# Patient Record
Sex: Male | Born: 1975
Health system: Southern US, Community
[De-identification: ages and names within clinical notes are randomized; demographics above are authoritative.]

## PROBLEM LIST (undated history)

## (undated) DIAGNOSIS — M1990 Unspecified osteoarthritis, unspecified site: Secondary | ICD-10-CM

---

## 1998-03-28 ENCOUNTER — Emergency Department (HOSPITAL_COMMUNITY): Admission: EM | Admit: 1998-03-28 | Discharge: 1998-03-28 | Payer: Self-pay | Admitting: Emergency Medicine

## 1998-05-12 ENCOUNTER — Emergency Department (HOSPITAL_COMMUNITY): Admission: EM | Admit: 1998-05-12 | Discharge: 1998-05-12 | Payer: Self-pay | Admitting: *Deleted

## 1998-06-25 ENCOUNTER — Emergency Department (HOSPITAL_COMMUNITY): Admission: EM | Admit: 1998-06-25 | Discharge: 1998-06-25 | Payer: Self-pay | Admitting: Emergency Medicine

## 1999-03-23 ENCOUNTER — Emergency Department (HOSPITAL_COMMUNITY): Admission: EM | Admit: 1999-03-23 | Discharge: 1999-03-23 | Payer: Self-pay | Admitting: Emergency Medicine

## 1999-03-24 ENCOUNTER — Encounter: Payer: Self-pay | Admitting: Emergency Medicine

## 2001-03-01 ENCOUNTER — Emergency Department (HOSPITAL_COMMUNITY): Admission: EM | Admit: 2001-03-01 | Discharge: 2001-03-02 | Payer: Self-pay | Admitting: Emergency Medicine

## 2003-01-25 ENCOUNTER — Emergency Department (HOSPITAL_COMMUNITY): Admission: EM | Admit: 2003-01-25 | Discharge: 2003-01-25 | Payer: Self-pay | Admitting: Emergency Medicine

## 2003-01-25 ENCOUNTER — Encounter: Payer: Self-pay | Admitting: Emergency Medicine

## 2012-03-27 DIAGNOSIS — M1990 Unspecified osteoarthritis, unspecified site: Secondary | ICD-10-CM

## 2012-03-27 HISTORY — DX: Unspecified osteoarthritis, unspecified site: M19.90

## 2012-04-14 ENCOUNTER — Encounter (HOSPITAL_BASED_OUTPATIENT_CLINIC_OR_DEPARTMENT_OTHER): Payer: Self-pay | Admitting: *Deleted

## 2012-04-20 ENCOUNTER — Ambulatory Visit (HOSPITAL_BASED_OUTPATIENT_CLINIC_OR_DEPARTMENT_OTHER)
Admission: RE | Admit: 2012-04-20 | Discharge: 2012-04-21 | Disposition: A | Discharge status: Home / Self Care | Payer: Worker's Compensation | Source: Ambulatory Visit | Attending: Orthopedic Surgery | Admitting: Orthopedic Surgery

## 2012-04-20 ENCOUNTER — Ambulatory Visit (HOSPITAL_BASED_OUTPATIENT_CLINIC_OR_DEPARTMENT_OTHER): Payer: Worker's Compensation | Admitting: Certified Registered Nurse Anesthetist

## 2012-04-20 ENCOUNTER — Encounter (HOSPITAL_BASED_OUTPATIENT_CLINIC_OR_DEPARTMENT_OTHER)
Admission: RE | Disposition: A | Discharge status: Home / Self Care | Payer: Self-pay | Source: Ambulatory Visit | Attending: Orthopedic Surgery

## 2012-04-20 ENCOUNTER — Encounter (HOSPITAL_BASED_OUTPATIENT_CLINIC_OR_DEPARTMENT_OTHER): Payer: Self-pay | Admitting: Certified Registered Nurse Anesthetist

## 2012-04-20 ENCOUNTER — Encounter (HOSPITAL_BASED_OUTPATIENT_CLINIC_OR_DEPARTMENT_OTHER): Payer: Self-pay

## 2012-04-20 ENCOUNTER — Ambulatory Visit (HOSPITAL_COMMUNITY): Payer: Worker's Compensation

## 2012-04-20 DIAGNOSIS — Y9269 Other specified industrial and construction area as the place of occurrence of the external cause: Secondary | ICD-10-CM | POA: Insufficient documentation

## 2012-04-20 DIAGNOSIS — M6240 Contracture of muscle, unspecified site: Secondary | ICD-10-CM | POA: Insufficient documentation

## 2012-04-20 DIAGNOSIS — M2140 Flat foot [pes planus] (acquired), unspecified foot: Secondary | ICD-10-CM | POA: Insufficient documentation

## 2012-04-20 DIAGNOSIS — X58XXXS Exposure to other specified factors, sequela: Secondary | ICD-10-CM | POA: Insufficient documentation

## 2012-04-20 DIAGNOSIS — M19079 Primary osteoarthritis, unspecified ankle and foot: Secondary | ICD-10-CM | POA: Insufficient documentation

## 2012-04-20 DIAGNOSIS — IMO0001 Reserved for inherently not codable concepts without codable children: Secondary | ICD-10-CM | POA: Insufficient documentation

## 2012-04-20 DIAGNOSIS — M1990 Unspecified osteoarthritis, unspecified site: Secondary | ICD-10-CM

## 2012-04-20 HISTORY — DX: Unspecified osteoarthritis, unspecified site: M19.90

## 2012-04-20 HISTORY — PX: ANKLE FUSION: SHX881

## 2012-04-20 SURGERY — ARTHRODESIS ANKLE
Anesthesia: General | Site: Ankle | Laterality: Right | Wound class: Clean

## 2012-04-20 MED ORDER — SENNA 8.6 MG PO TABS
1.0000 | ORAL_TABLET | Freq: Two times a day (BID) | ORAL | Status: DC
  Administered 2012-04-20: 8.6 mg via ORAL

## 2012-04-20 MED ORDER — HYDROMORPHONE HCL PF 1 MG/ML IJ SOLN
1.0000 mg | INTRAMUSCULAR | Status: DC | PRN
  Administered 2012-04-20 – 2012-04-21 (×4): 1 mg via INTRAVENOUS

## 2012-04-20 MED ORDER — CEFAZOLIN SODIUM 1-5 GM-% IV SOLN
INTRAVENOUS | Status: DC | PRN
  Administered 2012-04-20: 1 g via INTRAVENOUS
  Administered 2012-04-20: 2 g via INTRAVENOUS

## 2012-04-20 MED ORDER — METOCLOPRAMIDE HCL 5 MG PO TABS: 5.0000 mg | ORAL_TABLET | Freq: Three times a day (TID) | ORAL | Status: DC | PRN

## 2012-04-20 MED ORDER — MIDAZOLAM HCL 5 MG/5ML IJ SOLN
INTRAMUSCULAR | Status: DC | PRN
  Administered 2012-04-20: 1 mg via INTRAVENOUS

## 2012-04-20 MED ORDER — ONDANSETRON HCL 4 MG/2ML IJ SOLN: 4.0000 mg | Freq: Four times a day (QID) | INTRAMUSCULAR | Status: DC | PRN

## 2012-04-20 MED ORDER — 0.9 % SODIUM CHLORIDE (POUR BTL) OPTIME
TOPICAL | Status: DC | PRN
  Administered 2012-04-20: 1000 mL

## 2012-04-20 MED ORDER — BUPIVACAINE-EPINEPHRINE PF 0.5-1:200000 % IJ SOLN
INTRAMUSCULAR | Status: DC | PRN
  Administered 2012-04-20: 30 mL

## 2012-04-20 MED ORDER — ACETAMINOPHEN 10 MG/ML IV SOLN
1000.0000 mg | Freq: Once | INTRAVENOUS | Status: AC
  Administered 2012-04-20: 1000 mg via INTRAVENOUS

## 2012-04-20 MED ORDER — PROPOFOL 10 MG/ML IV EMUL
INTRAVENOUS | Status: DC | PRN
  Administered 2012-04-20: 300 mg via INTRAVENOUS

## 2012-04-20 MED ORDER — METOCLOPRAMIDE HCL 5 MG/ML IJ SOLN: 5.0000 mg | Freq: Three times a day (TID) | INTRAMUSCULAR | Status: DC | PRN

## 2012-04-20 MED ORDER — OXYCODONE HCL 5 MG PO TABS
5.0000 mg | ORAL_TABLET | ORAL | Status: DC | PRN
  Administered 2012-04-20 – 2012-04-21 (×5): 10 mg via ORAL

## 2012-04-20 MED ORDER — ONDANSETRON HCL 4 MG/2ML IJ SOLN
INTRAMUSCULAR | Status: DC | PRN
  Administered 2012-04-20: 4 mg via INTRAVENOUS

## 2012-04-20 MED ORDER — DIPHENHYDRAMINE HCL 12.5 MG/5ML PO ELIX: 12.5000 mg | ORAL_SOLUTION | ORAL | Status: DC | PRN

## 2012-04-20 MED ORDER — METHOCARBAMOL 500 MG PO TABS
500.0000 mg | ORAL_TABLET | Freq: Four times a day (QID) | ORAL | Status: DC | PRN
  Administered 2012-04-20 – 2012-04-21 (×4): 500 mg via ORAL

## 2012-04-20 MED ORDER — BACITRACIN ZINC 500 UNIT/GM EX OINT
TOPICAL_OINTMENT | CUTANEOUS | Status: DC | PRN
  Administered 2012-04-20: 1 via TOPICAL

## 2012-04-20 MED ORDER — SODIUM CHLORIDE 0.9 % IV SOLN
INTRAVENOUS | Status: DC
  Administered 2012-04-20: 14:00:00 via INTRAVENOUS

## 2012-04-20 MED ORDER — DOCUSATE SODIUM 100 MG PO CAPS: 100.0000 mg | ORAL_CAPSULE | Freq: Two times a day (BID) | ORAL | Status: AC

## 2012-04-20 MED ORDER — SENNOSIDES 8.6 MG PO TABS: 2.0000 | ORAL_TABLET | Freq: Every day | ORAL | Status: AC

## 2012-04-20 MED ORDER — BUPIVACAINE HCL (PF) 0.5 % IJ SOLN
INTRAMUSCULAR | Status: DC | PRN
  Administered 2012-04-20: 10 mL

## 2012-04-20 MED ORDER — ONDANSETRON HCL 4 MG PO TABS: 4.0000 mg | ORAL_TABLET | Freq: Four times a day (QID) | ORAL | Status: DC | PRN

## 2012-04-20 MED ORDER — DEXAMETHASONE SODIUM PHOSPHATE 10 MG/ML IJ SOLN
INTRAMUSCULAR | Status: DC | PRN
  Administered 2012-04-20: 10 mg via INTRAVENOUS

## 2012-04-20 MED ORDER — LIDOCAINE HCL (CARDIAC) 20 MG/ML IV SOLN
INTRAVENOUS | Status: DC | PRN
  Administered 2012-04-20: 40 mg via INTRAVENOUS

## 2012-04-20 MED ORDER — HYDROMORPHONE HCL PF 1 MG/ML IJ SOLN
0.2500 mg | INTRAMUSCULAR | Status: DC | PRN
  Administered 2012-04-20: 0.5 mg via INTRAVENOUS

## 2012-04-20 MED ORDER — METHOCARBAMOL 100 MG/ML IJ SOLN: 500.0000 mg | Freq: Four times a day (QID) | INTRAVENOUS | Status: DC | PRN

## 2012-04-20 MED ORDER — DROPERIDOL 2.5 MG/ML IJ SOLN
INTRAMUSCULAR | Status: DC | PRN
  Administered 2012-04-20: 0.625 mg via INTRAVENOUS

## 2012-04-20 MED ORDER — FENTANYL CITRATE 0.05 MG/ML IJ SOLN
100.0000 ug | INTRAMUSCULAR | Status: DC | PRN
  Administered 2012-04-20: 100 ug via INTRAVENOUS

## 2012-04-20 MED ORDER — MIDAZOLAM HCL 2 MG/2ML IJ SOLN
2.0000 mg | INTRAMUSCULAR | Status: DC | PRN
  Administered 2012-04-20: 2 mg via INTRAVENOUS

## 2012-04-20 MED ORDER — FENTANYL CITRATE 0.05 MG/ML IJ SOLN
INTRAMUSCULAR | Status: DC | PRN
  Administered 2012-04-20 (×2): 25 ug via INTRAVENOUS
  Administered 2012-04-20: 50 ug via INTRAVENOUS
  Administered 2012-04-20: 25 ug via INTRAVENOUS

## 2012-04-20 MED ORDER — DOCUSATE SODIUM 100 MG PO CAPS
100.0000 mg | ORAL_CAPSULE | Freq: Two times a day (BID) | ORAL | Status: DC
  Administered 2012-04-20: 100 mg via ORAL

## 2012-04-20 MED ORDER — ASPIRIN EC 325 MG PO TBEC: 325.0000 mg | DELAYED_RELEASE_TABLET | Freq: Two times a day (BID) | ORAL | Status: AC

## 2012-04-20 MED ORDER — LACTATED RINGERS IV SOLN
INTRAVENOUS | Status: DC
  Administered 2012-04-20 (×3): via INTRAVENOUS

## 2012-04-20 MED ORDER — OXYCODONE HCL 5 MG PO TABS: 5.0000 mg | ORAL_TABLET | ORAL | Status: AC | PRN

## 2012-04-20 SURGICAL SUPPLY — 70 items
BAG DECANTER FOR FLEXI CONT (MISCELLANEOUS) IMPLANT
BANDAGE ESMARK 6X9 LF (GAUZE/BANDAGES/DRESSINGS) ×1 IMPLANT
BIT DRILL 2.9 CANN QC NONSTRL (BIT) ×2 IMPLANT
BIT DRILL 5 ACE CANN QC (BIT) ×4 IMPLANT
BLADE SURG 15 STRL LF DISP TIS (BLADE) ×4 IMPLANT
BLADE SURG 15 STRL SS (BLADE) ×4
BNDG COHESIVE 4X5 TAN STRL (GAUZE/BANDAGES/DRESSINGS) ×2 IMPLANT
BNDG COHESIVE 6X5 TAN STRL LF (GAUZE/BANDAGES/DRESSINGS) ×2 IMPLANT
BNDG ESMARK 6X9 LF (GAUZE/BANDAGES/DRESSINGS) ×2
CHLORAPREP W/TINT 26ML (MISCELLANEOUS) ×2 IMPLANT
CLOTH BEACON ORANGE TIMEOUT ST (SAFETY) ×2 IMPLANT
COVER TABLE BACK 60X90 (DRAPES) ×2 IMPLANT
CUFF TOURNIQUET SINGLE 18IN (TOURNIQUET CUFF) IMPLANT
CUFF TOURNIQUET SINGLE 34IN LL (TOURNIQUET CUFF) ×2 IMPLANT
DECANTER SPIKE VIAL GLASS SM (MISCELLANEOUS) IMPLANT
DRAPE C-ARM 42X72 X-RAY (DRAPES) ×2 IMPLANT
DRAPE EXTREMITY T 121X128X90 (DRAPE) ×2 IMPLANT
DRAPE INCISE IOBAN 66X45 STRL (DRAPES) ×2 IMPLANT
DRAPE U-SHAPE 47X51 STRL (DRAPES) ×2 IMPLANT
DRAPE U-SHAPE 76X120 STRL (DRAPES) ×2 IMPLANT
DRSG ADAPTIC 3X8 NADH LF (GAUZE/BANDAGES/DRESSINGS) ×2 IMPLANT
DRSG EMULSION OIL 3X3 NADH (GAUZE/BANDAGES/DRESSINGS) IMPLANT
DRSG PAD ABDOMINAL 8X10 ST (GAUZE/BANDAGES/DRESSINGS) ×4 IMPLANT
ELECT REM PT RETURN 9FT ADLT (ELECTROSURGICAL) ×2
ELECTRODE REM PT RTRN 9FT ADLT (ELECTROSURGICAL) ×1 IMPLANT
GLOVE BIO SURGEON STRL SZ8 (GLOVE) ×2 IMPLANT
GLOVE BIOGEL PI IND STRL 8 (GLOVE) ×1 IMPLANT
GLOVE BIOGEL PI INDICATOR 8 (GLOVE) ×1
GOWN PREVENTION PLUS XLARGE (GOWN DISPOSABLE) ×2 IMPLANT
GOWN PREVENTION PLUS XXLARGE (GOWN DISPOSABLE) ×2 IMPLANT
K-WIRE ACE 1.6X6 (WIRE) ×4
KWIRE ACE 1.6X6 (WIRE) ×2 IMPLANT
NEEDLE HYPO 22GX1.5 SAFETY (NEEDLE) IMPLANT
PACK BASIN DAY SURGERY FS (CUSTOM PROCEDURE TRAY) ×2 IMPLANT
PAD CAST 4YDX4 CTTN HI CHSV (CAST SUPPLIES) ×2 IMPLANT
PADDING CAST COTTON 4X4 STRL (CAST SUPPLIES) ×2
PADDING CAST COTTON 6X4 STRL (CAST SUPPLIES) ×2 IMPLANT
PENCIL BUTTON HOLSTER BLD 10FT (ELECTRODE) ×2 IMPLANT
PIN THREADED GUIDE ACE (PIN) ×6 IMPLANT
PUTTY DBM STAGRAFT 10CC (Putty) ×2 IMPLANT
SCREW ACE CAN 4.0 40M (Screw) ×2 IMPLANT
SCREW ACE CAN 4.0 55M (Screw) ×2 IMPLANT
SCREW ACE CAN 4.0 60M (Screw) ×2 IMPLANT
SCREW CANN 6.5 80MM (Screw) ×1 IMPLANT
SCREW CANN 6.5 90MM (Screw) ×1 IMPLANT
SCREW CANN LG 6.5 FLT 80X22 (Screw) ×1 IMPLANT
SCREW CANN LG 6.5 FLT 90X22 (Screw) ×1 IMPLANT
SCREW CORTICAL 3.5MM 65MM (Screw) ×2 IMPLANT
SHEET MEDIUM DRAPE 40X70 STRL (DRAPES) ×4 IMPLANT
SLEEVE SCD COMPRESS KNEE MED (MISCELLANEOUS) ×2 IMPLANT
SPLINT FAST PLASTER 5X30 (CAST SUPPLIES) ×20
SPLINT PLASTER CAST FAST 5X30 (CAST SUPPLIES) ×20 IMPLANT
SPONGE GAUZE 4X4 12PLY (GAUZE/BANDAGES/DRESSINGS) ×2 IMPLANT
SPONGE LAP 18X18 X RAY DECT (DISPOSABLE) ×2 IMPLANT
STOCKINETTE 6  STRL (DRAPES) ×1
STOCKINETTE 6 STRL (DRAPES) ×1 IMPLANT
SUCTION FRAZIER TIP 10 FR DISP (SUCTIONS) ×2 IMPLANT
SUT MNCRL AB 3-0 PS2 18 (SUTURE) ×2 IMPLANT
SUT MNCRL AB 4-0 PS2 18 (SUTURE) IMPLANT
SUT PROLENE 3 0 PS 2 (SUTURE) ×2 IMPLANT
SUT VIC AB 0 SH 27 (SUTURE) ×2 IMPLANT
SUT VIC AB 2-0 SH 18 (SUTURE) IMPLANT
SUT VIC AB 2-0 SH 27 (SUTURE)
SUT VIC AB 2-0 SH 27XBRD (SUTURE) IMPLANT
SUT VICRYL 4-0 PS2 18IN ABS (SUTURE) IMPLANT
SYR BULB 3OZ (MISCELLANEOUS) ×2 IMPLANT
SYR CONTROL 10ML LL (SYRINGE) IMPLANT
TUBE CONNECTING 20X1/4 (TUBING) ×2 IMPLANT
UNDERPAD 30X30 INCONTINENT (UNDERPADS AND DIAPERS) ×2 IMPLANT
WATER STERILE IRR 1000ML POUR (IV SOLUTION) ×2 IMPLANT

## 2012-04-20 NOTE — Transfer of Care (Signed)
Immediate Anesthesia Transfer of Care Note  Patient: Ryan Vargas  Procedure(s) Performed: Procedure(s) (LRB): ARTHRODESIS ANKLE (Right)  Patient Location: PACU  Anesthesia Type: GA combined with regional for post-op pain  Level of Consciousness: awake, alert , oriented and patient cooperative  Airway & Oxygen Therapy: Patient Spontanous Breathing and Patient connected to face mask oxygen  Post-op Assessment: Report given to PACU RN and Post -op Vital signs reviewed and stable  Post vital signs: Reviewed and stable  Complications: No apparent anesthesia complications

## 2012-04-20 NOTE — Anesthesia Preprocedure Evaluation (Signed)
Anesthesia Evaluation  Patient identified by MRN, date of birth, ID band Patient awake    Reviewed: Allergy & Precautions, H&P , NPO status , Patient's Chart, lab work & pertinent test results  Airway Mallampati: II TM Distance: >3 FB Neck ROM: Full    Dental No notable dental hx. (+) Teeth Intact and Dental Advisory Given   Pulmonary neg pulmonary ROS,  breath sounds clear to auscultation  Pulmonary exam normal       Cardiovascular negative cardio ROS  Rhythm:Regular Rate:Normal     Neuro/Psych negative neurological ROS  negative psych ROS   GI/Hepatic negative GI ROS, Neg liver ROS,   Endo/Other  negative endocrine ROS  Renal/GU negative Renal ROS  negative genitourinary   Musculoskeletal   Abdominal   Peds  Hematology negative hematology ROS (+)   Anesthesia Other Findings   Reproductive/Obstetrics negative OB ROS                           Anesthesia Physical Anesthesia Plan  ASA: I  Anesthesia Plan: General   Post-op Pain Management:    Induction: Intravenous  Airway Management Planned: LMA  Additional Equipment:   Intra-op Plan:   Post-operative Plan: Extubation in OR  Informed Consent: I have reviewed the patients History and Physical, chart, labs and discussed the procedure including the risks, benefits and alternatives for the proposed anesthesia with the patient or authorized representative who has indicated his/her understanding and acceptance.   Dental advisory given  Plan Discussed with: CRNA  Anesthesia Plan Comments:         Anesthesia Quick Evaluation  

## 2012-04-20 NOTE — Discharge Instructions (Addendum)
Ryan Hewitt, MD °[facility] ° °Please read the following information regarding your care after surgery. ° °Medications  °You only need a prescription for the narcotic pain medicine (ex. oxycodone, Percocet, Norco).  All of the other medicines listed below are available over the counter. °X acetominophen (Tylenol) 650 mg every 4-6 hours as you need for minor pain °X oxycodone as prescribed for moderate to severe pain ° °Narcotic pain medicine (ex. oxycodone, Percocet, Vicodin) will cause constipation.  To prevent this problem, take the following medicines while you are taking any pain medicine. °X docusate sodium (Colace) 100 mg twice a day X senna (Senokot) 2 tablets twice a day ° °X To help prevent blood clots, take an aspirin (325 mg) once a day for a month after surgery.  You should also get up every hour while you are awake to move around.   ° °Weight Bearing °? Bear weight when you are able on your operated leg or foot. °? Bear weight only on the heel of your operated foot in the post-op shoe. °X Do not bear any weight on the operated leg or foot. ° °Cast / Splint / Dressing °X Keep your splint or cast clean and dry.  Don’t put anything (coat hanger, pencil, etc) down inside of it.  If it gets damp, use a hair dryer on the cool setting to dry it.  If it gets soaked, call the office to schedule an appointment for a cast change. °? Remove your dressing 3 days after surgery and cover the incisions with dry dressings.   ° °After your dressing, cast or splint is removed; you may shower, but do not soak or scrub the wound.  Allow the water to run over it, and then gently pat it dry. ° °Swelling °It is normal for you to have swelling where you had surgery.  To reduce swelling and pain, keep your toes above your nose for at least 3 days after surgery.  It may be necessary to keep your foot or leg elevated for several weeks.  If it hurts, it should be elevated. ° °Follow Up °Call my office at [phone]  when you are discharged from the hospital or surgery center to schedule an appointment to be seen two weeks after surgery. ° °Call my office at [phone] if you develop a fever >101.5° F, nausea, vomiting, bleeding from the surgical site or severe pain.   ° ° °Post Anesthesia Home Care Instructions ° °Activity: °Get plenty of rest for the remainder of the day. A responsible adult should stay with you for 24 hours following the procedure.  °For the next 24 hours, DO NOT: °-Drive a car °-Operate machinery °-Drink alcoholic beverages °-Take any medication unless instructed by your physician °-Make any legal decisions or sign important papers. ° °Meals: °Start with liquid foods such as gelatin or soup. Progress to regular foods as tolerated. Avoid greasy, spicy, heavy foods. If nausea and/or vomiting occur, drink only clear liquids until the nausea and/or vomiting subsides. Call your physician if vomiting continues. ° °Special Instructions/Symptoms: °Your throat may feel dry or sore from the anesthesia or the breathing tube placed in your throat during surgery. If this causes discomfort, gargle with warm salt water. The discomfort should disappear within 24 hours. ° ° ° °Regional Anesthesia Blocks ° °1. Numbness or the inability to move the "blocked" extremity may last from 3-48 hours after placement. The length of time depends on the medication injected and your individual response to the medication.   If the numbness is not going away after 48 hours, call your surgeon. ° °2. The extremity that is blocked will need to be protected until the numbness is gone and the  Strength has returned. Because you cannot feel it, you will need to take extra care to avoid injury. Because it may be weak, you may have difficulty moving it or using it. You may not know what position it is in without looking at it while the block is in effect. ° °3. For blocks in the legs and feet, returning to weight bearing and walking needs to be  done carefully. You will need to wait until the numbness is entirely gone and the strength has returned. You should be able to move your leg and foot normally before you try and bear weight or walk. You will need someone to be with you when you first try to ensure you do not fall and possibly risk injury. ° °4. Bruising and tenderness at the needle site are common side effects and will resolve in a few days. ° °5. Persistent numbness or new problems with movement should be communicated to the surgeon or the [facility] ([phone])/ [facility] ([phone]). °

## 2012-04-20 NOTE — Anesthesia Procedure Notes (Addendum)
Anesthesia Regional Block:  Popliteal block  Pre-Anesthetic Checklist: ,, timeout performed, Correct Patient, Correct Site, Correct Laterality, Correct Procedure, Correct Position, site marked, Risks and benefits discussed, pre-op evaluation, post-op pain management  Laterality: Right  Prep: Maximum Sterile Barrier Precautions used and chloraprep       Needles:  Injection technique: Single-shot  Needle Type: Echogenic Stimulator Needle          Additional Needles:  Procedures: ultrasound guided and nerve stimulator Popliteal block  Nerve Stimulator or Paresthesia:  Response: Peroneal, 0.4 mA,  Response: Tibial,   Additional Responses:   Narrative:  Start time: 04/20/2012 7:10 AM End time: 04/20/2012 7:20 AM Injection made incrementally with aspirations every 5 mL. Anesthesiologist: Sampson Goon, MD  Additional Notes: 2% Lidocaine skin wheel. Saphenous block above the ankle with 10cc of 0.5%Bupivicaine plain.  Popliteal block Procedure Name: LMA Insertion Date/Time: 04/20/2012 7:37 AM Performed by: Gildo Crisco D Pre-anesthesia Checklist: Patient identified, Emergency Drugs available, Suction available and Patient being monitored Patient Re-evaluated:Patient Re-evaluated prior to inductionOxygen Delivery Method: Circle System Utilized Preoxygenation: Pre-oxygenation with 100% oxygen Intubation Type: IV induction Ventilation: Mask ventilation without difficulty LMA: LMA inserted LMA Size: 5.0 Number of attempts: 1 Placement Confirmation: positive ETCO2 Tube secured with: Tape Dental Injury: Teeth and Oropharynx as per pre-operative assessment

## 2012-04-20 NOTE — Progress Notes (Signed)
Assisted Dr. Fitzgerald with right, ultrasound guided, popliteal/saphenous block. Side rails up, monitors on throughout procedure. See vital signs in flow sheet. Tolerated Procedure well. 

## 2012-04-20 NOTE — Op Note (Signed)
NAME:  Ryan Vargas, MCGIBBON NO.:  MEDICAL RECORD NO.:  [id number]  LOCATION:                                 FACILITY:  PHYSICIAN:  Toni Arthurs, MD             DATE OF BIRTH:  DATE OF PROCEDURE:  04/20/2012 DATE OF DISCHARGE:                              OPERATIVE REPORT   PREOPERATIVE DIAGNOSES: 1. Right subtalar arthritis. 2. Right talonavicular arthritis. 3. Gastrocnemius contracture.  POSTOPERATIVE DIAGNOSES: 1. Right subtalar arthritis. 2. Right talonavicular arthritis. 3. Gastrocnemius contracture.  PROCEDURES: 1. Right subtalar arthrodesis. 2. Right talonavicular arthrodesis. 3. Right gastrocnemius recession. 4. Intraoperative interpretation of fluoroscopic imaging greater than     1 hour.  SURGEON:  Toni Arthurs, MD  ANESTHESIA:  General, regional.  ESTIMATED BLOOD LOSS:  Minimal.  TOURNIQUET TIME:  150 minutes at 225 mmHg.  COMPLICATIONS:  None apparent.  DISPOSITION:  Extubated, awake, and stable to recovery.  INDICATIONS FOR PROCEDURE:  The patient is a 36 year old male without significant past medical history.  He had an ankle injury at work approximately a year and a half ago.  He failed to improve despite appropriate immobilization and therapy.  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  He also was found to have significant subtalar and talonavicular joint arthritis.  He presents now for arthrodesis of the subtalar and talonavicular joints with correction of his flatfoot and release of his gastrocnemius contracture.  He understands the risks and benefits, alternative treatment options, and elects surgical treatment.  He specifically understands the risks of bleeding, infection, nerve damage, blood clots, need for additional surgery, amputation, and death.  PROCEDURE IN DETAIL:  After preoperative consent was obtained and the correct operative site was identified, the patient was brought to the operating  room and placed supine on the operating table.  General anesthesia was induced.  Preoperative antibiotics were administered. Surgical time-out was taken.  The right lower extremity was prepped and draped in the standard sterile fashion with a tourniquet around the thigh.  The extremity was exsanguinated and the tourniquet was inflated to 225 mmHg.  A longitudinal incision was made over the medial aspect of the leg.  Blunt dissection was carried down through the subcutaneous tissue.  The superficial fascia was incised.  The gastrocnemius tendon was identified.  It was transected from medial to lateral under direct vision.  After releasing the gastrocnemius tendon, the patient's ankle would dorsiflex approximately 30 degrees with the knee extended.  The wound was irrigated.  Inverted simple sutures of 3-0 Monocryl were used to close the subcutaneous tissue and the 3-0 Prolene was used to close the skin layer.  Attention was then turned to the lateral aspect of the hindfoot. Longitudinal incision was made at the sinus tarsi.  Sharp dissection was carried down through the skin.  Blunt dissection was carried down through the subcutaneous tissue.  The extensor digitorum brevis muscle belly was released off the anterior process of the calcaneus.  Sinus tarsi was entered and the posterior facet was exposed.  The subtalar joint was opened with a lamina spreader.  The remaining  articular cartilage was removed with curettes and rongeurs.  The middle facet coalition was broken up with a straight osteotome allowing appropriate correction of the hindfoot.  The wound was irrigated.  A 3.5-mm drill bit was then used to perforate the facets of the subtalar joint in multiple locations.  A quarter-inch osteotome was then used to break up the remaining subchondral bone.  At this point, a dorsal incision was made over the talonavicular joint. The interval between the extensor hallucis longus and the  tibialis anterior was developed.  Care was taken to protect the neurovascular bundle.  It was mobilized and retracted laterally.  The joint capsule was released over the talonavicular joint.  The joint was cleared of all fibrous scar tissue.  The dorsal osteophytes were taken down.  The joint was noted to be severely arthritic.  The remaining cartilage was removed with curettes and rongeurs.  The joint was opened with a lamina spreader and irrigated copiously.  The 3.5-mm drill bit was then used to perforate the head of the talus in the base of the navicular in multiple locations and the quarter-inch osteotome was again used to break up the subchondral bone.  The joint was then packed with a few mL of demineralized bone matrix.  The subtalar joint was similarly packed with DBM.  The subtalar joint was reduced with the hindfoot corrected to a neutral position.  A guidewire was then inserted from the calcaneal tuberosity across the posterior facet into the head of the talus.  A second guidepin was placed from the calcaneal tuberosity across the posterior facet into the talar dome laterally.  AP ankle, AP foot Harris heel and lateral views were obtained showing appropriate position and length of both guidepins after several adjustments were made.  A stab incision was made over the medial guidepin and a 6.5-mm partially- threaded screw was inserted and noted to have excellent purchase.  It compressed the medial portion of the posterior facet appropriately.  The second guidepin was then overdrilled after percutaneous incision was made.  Again, a partially-threaded 6.5-mm screw was inserted over the guidepin and noted to have excellent purchase.  Attention was then turned to the talonavicular joint.  K-wires were inserted percutaneously crossing the joint in three places.  AP foot and lateral views of the foot were obtained showing appropriate position of all three guidepins.  Stab incisions  were made at all three locations. All three guidepins were overdrilled and 4-mm partially-threaded cannulated screws were inserted.  These were noted to compress the talonavicular joint appropriately.  A fourth guidepin was then inserted from the dorsal aspect of the navicular crossing the talonavicular and subtalar joints.  It was directed into the anterior process of the calcaneus.  The position of the guidepin was verified on AP and lateral views of the foot.  The guidepin was overdrilled.  A 3.5-mm fully- threaded screw was then inserted from the navicular all the way into the calcaneus as a position screw.  Final AP foot, lateral foot, Harris heel and AP ankle views were obtained showing an appropriate position, length of all hardware and appropriate reduction of the subtalar and talonavicular joints.  Both wounds were then again irrigated.  The remaining bone graft was packed into the subtalar joint.  The origin of the EDB was then repaired with 0 Vicryl sutures.  The subcutaneous tissue was closed with inverted simple sutures of 3-0 Monocryl and a 3-0 Prolene was used to close the skin incision.  The dorsal  incision was again irrigated and the extensor retinaculum was closed with 0 Vicryl figure-of-eight sutures.  Subcutaneous tissue was closed with 3-0 Monocryl inverted simple sutures and the 3-0 Prolene was used to close the skin incision.  Stab incisions were all closed with horizontal or simple sutures of 3-0 Prolene.  The tourniquet had been released at 2 hours and 30 minutes and hemostasis was achieved prior to closure. Sterile dressings were applied followed by well-padded short-leg splint. The patient was awakened by Anesthesia and transported to the recovery room in stable condition.  FOLLOWUP PLAN:  The patient will be observed overnight for pain control. He will follow up with me in 2 weeks for suture removal and conversion to a cast.     Toni Arthurs,  MD     JH/MEDQ  D:  04/20/2012  T:  04/20/2012  Job:  [id number]

## 2012-04-20 NOTE — Anesthesia Postprocedure Evaluation (Signed)
  Anesthesia Post-op Note  Patient: Ryan Vargas  Procedure(s) Performed: Procedure(s) (LRB): ARTHRODESIS ANKLE (Right)  Patient Location: PACU  Anesthesia Type: GA combined with regional for post-op pain  Level of Consciousness: awake  Airway and Oxygen Therapy: Patient Spontanous Breathing and Patient connected to nasal cannula oxygen  Post-op Pain: mild  Post-op Assessment: Post-op Vital signs reviewed, Patient's Cardiovascular Status Stable, Respiratory Function Stable, Patent Airway and No signs of Nausea or vomiting  Post-op Vital Signs: Reviewed and stable  Complications: No apparent anesthesia complications

## 2012-04-20 NOTE — Brief Op Note (Signed)
04/20/2012  11:08 AM  PATIENT:  Barbette Reichmann Rinn  36 y.o. male  PRE-OPERATIVE DIAGNOSIS:  RIGHT SUBTALAR and talo- NAVICULAR ARTHRITIS, gastroc contracture  POST-OPERATIVE DIAGNOSIS:  RIGHT SUBTALAR and talo- NAVICULAR ARTHRITIS, gastroc contracture  Procedure(s): 1.  Right subtalar arthrodesis 2.  Right talonavicular arthrodesis 3.  Right gastroc recession 4.  Fluoro > 1 hour  SURGEON:  Toni Arthurs, MD  ASSISTANT: n/a  ANESTHESIA:   General, regional  EBL:  minimal   TOURNIQUET:   Total Tourniquet Time Documented: Thigh (Right) - 150 minutes  COMPLICATIONS:  None apparent  DISPOSITION:  Extubated, awake and stable to recovery.  DICTATION ID:  [id number]

## 2012-04-20 NOTE — H&P (Signed)
Ryan Vargas is an 36 y.o. male.   Chief Complaint: right foot pain HPI: 36 y/o male with h/o right ankle injury at work over a year ago.  He has persistent pain.  Imaging studies revealed a subtalar coalition as well as subtalar and talonavicular arthritis.  He has failed treatment with activity modification, oral pain medicine, bracing and PT.  He presents now for operative treatmnet of this condition.  Past Medical History  Diagnosis Date  . Arthritis 03/2012    right subtalar navicular arthritis    Past Surgical History  Procedure Date  . No past surgeries     History reviewed. No pertinent family history. Social History:  reports that he has never smoked. He has never used smokeless tobacco. He reports that he does not drink alcohol or use illicit drugs.  Allergies: No Known Allergies  No prescriptions prior to admission    No results found for this or any previous visit (from the past 48 hour(s)). No results found.  ROS  No recent f/c/n/v/wt loss.  Blood pressure 137/79, pulse 59, temperature 98 F (36.7 C), temperature source Oral, resp. rate 20, height 6' 1.5" (1.867 m), weight 102.513 kg (226 lb), SpO2 99.00%. Physical Exam  wn wd male in nad.  A and O x 4.  Mood and affect normal.  EOMI.  Respirations unlabored.  R foot with healthy and intact skin.  2+ dp and pt pulses.  Feels LT normally throughout foot.  5/5 strength in PF and DF of ankle.  + gastroc contracture.  No subtalar motion.  TTP at TN and ST joints.No lymphadenopathy.  Assessment/Plan R subtalar and talonavicular arthritis with gastroc contracture - to OR for subtalar and talonavicular arthrodesis and gastroc recession.  The risks and benefits of the alternative treatment options have been discussed in detail.  The patient wishes to proceed with surgery and specifically understands risks of bleeding, infection, nerve damage, blood clots, need for additional surgery, amputation and death.   Toni Arthurs 04/26/2012, 7:15 AM

## 2012-04-21 NOTE — Discharge Summary (Signed)
Physician Discharge Summary  Patient ID: Ryan Vargas MRN: [id number] DOB/AGE: 28-Sep-1976 35 y.o.  Admit date: 04/20/2012 Discharge date: 04/21/2012  Admission Diagnoses:  Right flatfoot deformity  Discharge Diagnoses: same Active Problems:  * No active hospital problems. *    Discharged Condition: stable  Hospital Course: unremarkable  Consults: None  Significant Diagnostic Studies: none  Treatments: surgery: subtalar and talonavicular arthrodeses  Discharge Exam: Blood pressure 141/85, pulse 79, temperature 98.2 F (36.8 C), temperature source Oral, resp. rate 20, height 6' 1.5" (1.867 m), weight 102.513 kg (226 lb), SpO2 98.00%. splint intact.  Disposition: 01-Home or Self Care  Discharge Orders    Future Orders Please Complete By Expires   Diet - low sodium heart healthy      Call MD / Call 911      Comments:   If you experience chest pain or shortness of breath, CALL 911 and be transported to the hospital emergency room.  If you develope a fever above 101 F, pus (white drainage) or increased drainage or redness at the wound, or calf pain, call your surgeon's office.   Constipation Prevention      Comments:   Drink plenty of fluids.  Prune juice may be helpful.  You may use a stool softener, such as Colace (over the counter) 100 mg twice a day.  Use MiraLax (over the counter) for constipation as needed.   Increase activity slowly as tolerated        Medication List  As of 04/21/2012  3:06 PM   TAKE these medications         aspirin EC 325 MG tablet   Take 1 tablet (325 mg total) by mouth 2 (two) times daily.      docusate sodium 100 MG capsule   Commonly known as: COLACE   Take 1 capsule (100 mg total) by mouth 2 (two) times daily.      oxyCODONE 5 MG immediate release tablet   Commonly known as: Oxy IR/ROXICODONE   Take 1-2 tablets (5-10 mg total) by mouth every 4 (four) hours as needed for pain.      senna 8.6 MG tablet   Commonly known as: SENOKOT   Take 2 tablets (17.2 mg total) by mouth daily.             SignedToni Arthurs 04/21/2012, 3:06 PM

## 2012-04-24 LAB — POCT HEMOGLOBIN-HEMACUE: Hemoglobin: 14.6 g/dL (ref 13.0–17.0)

## 2012-04-26 ENCOUNTER — Encounter (HOSPITAL_BASED_OUTPATIENT_CLINIC_OR_DEPARTMENT_OTHER): Payer: Self-pay | Admitting: Orthopedic Surgery

## 2013-04-20 ENCOUNTER — Other Ambulatory Visit: Payer: Self-pay | Admitting: Orthopedic Surgery

## 2013-04-20 DIAGNOSIS — M79671 Pain in right foot: Secondary | ICD-10-CM

## 2013-04-27 ENCOUNTER — Ambulatory Visit
Admission: RE | Admit: 2013-04-27 | Discharge: 2013-04-27 | Disposition: A | Discharge status: Home / Self Care | Payer: Worker's Compensation | Source: Ambulatory Visit | Attending: Orthopedic Surgery | Admitting: Orthopedic Surgery

## 2013-04-27 DIAGNOSIS — M79671 Pain in right foot: Secondary | ICD-10-CM

## 2013-08-11 IMAGING — RF DG ANKLE COMPLETE 3+V*R*
1 series · 6 of 6 positions shown · non-contrast
Comparison: None.

CLINICAL DATA: Right subtalar and talonavicular arthritis

RIGHT ANKLE - COMPLETE 3+ VIEW,C-ARM 61-120 MIN

[Series 1: run · 6 of 6 slices shown]
[im 1/6]
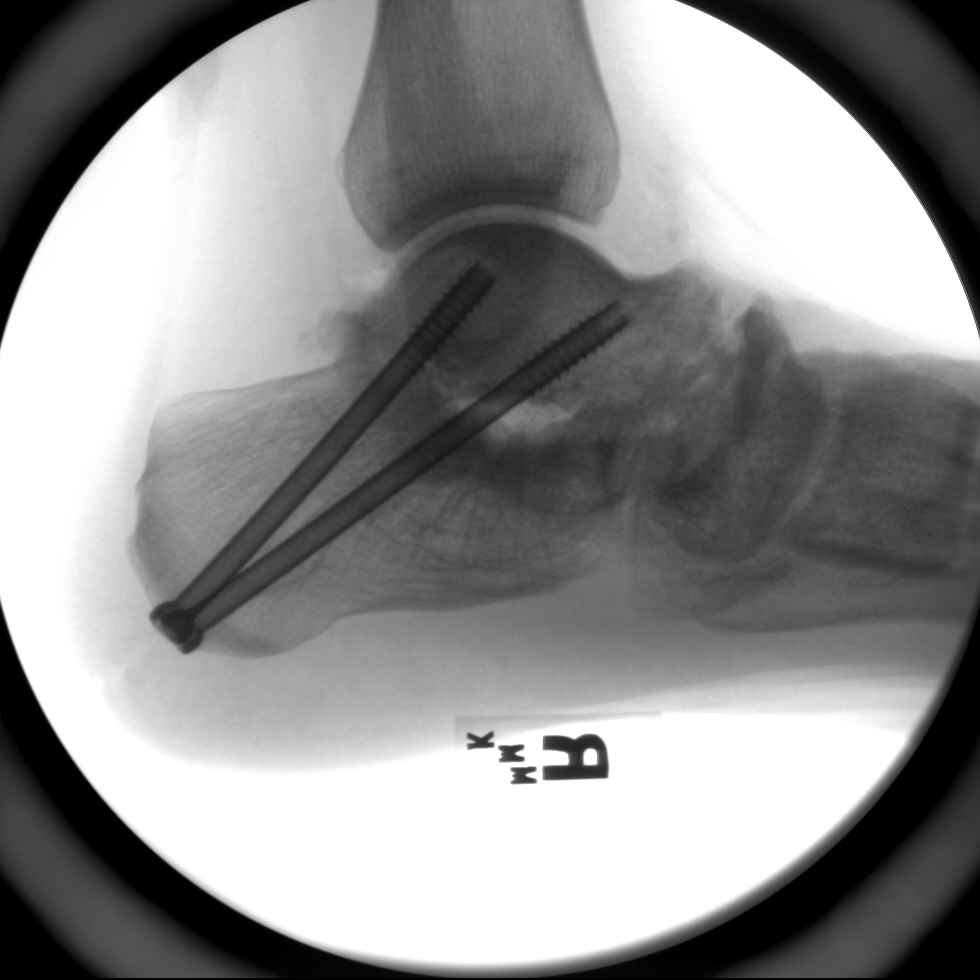
[im 2/6]
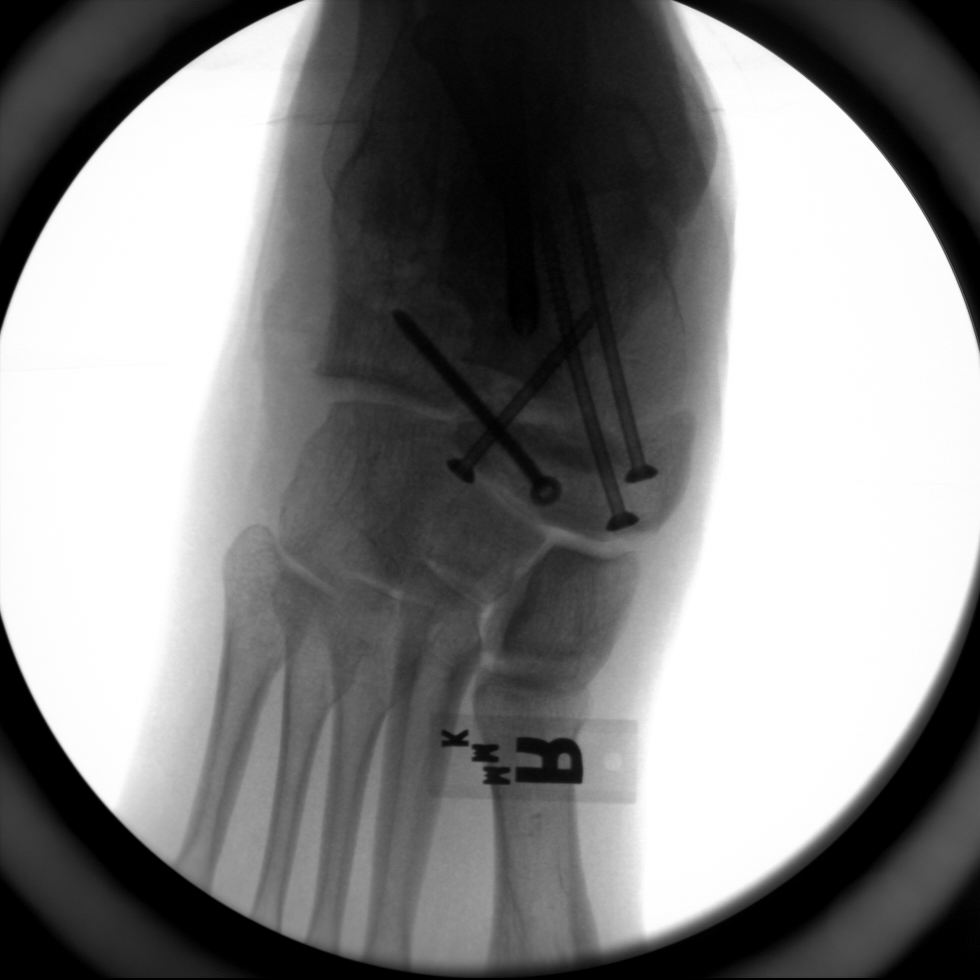
[im 3/6]
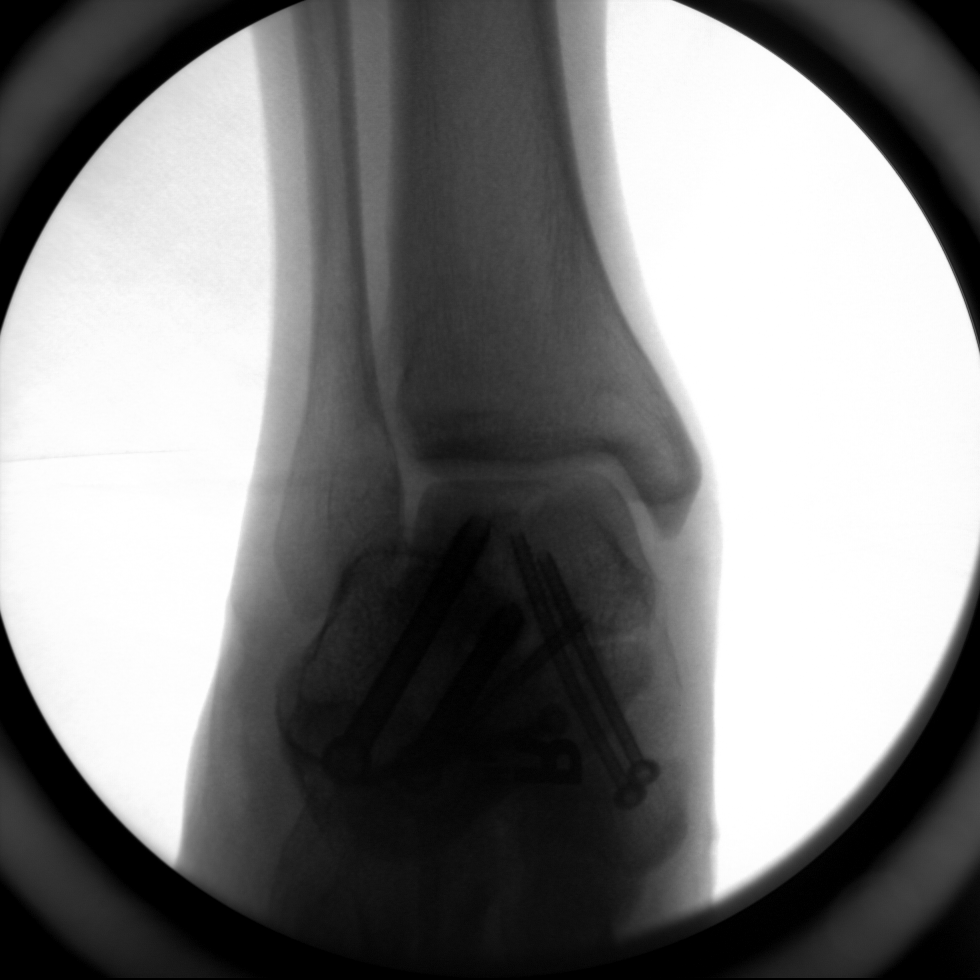
[im 4/6]
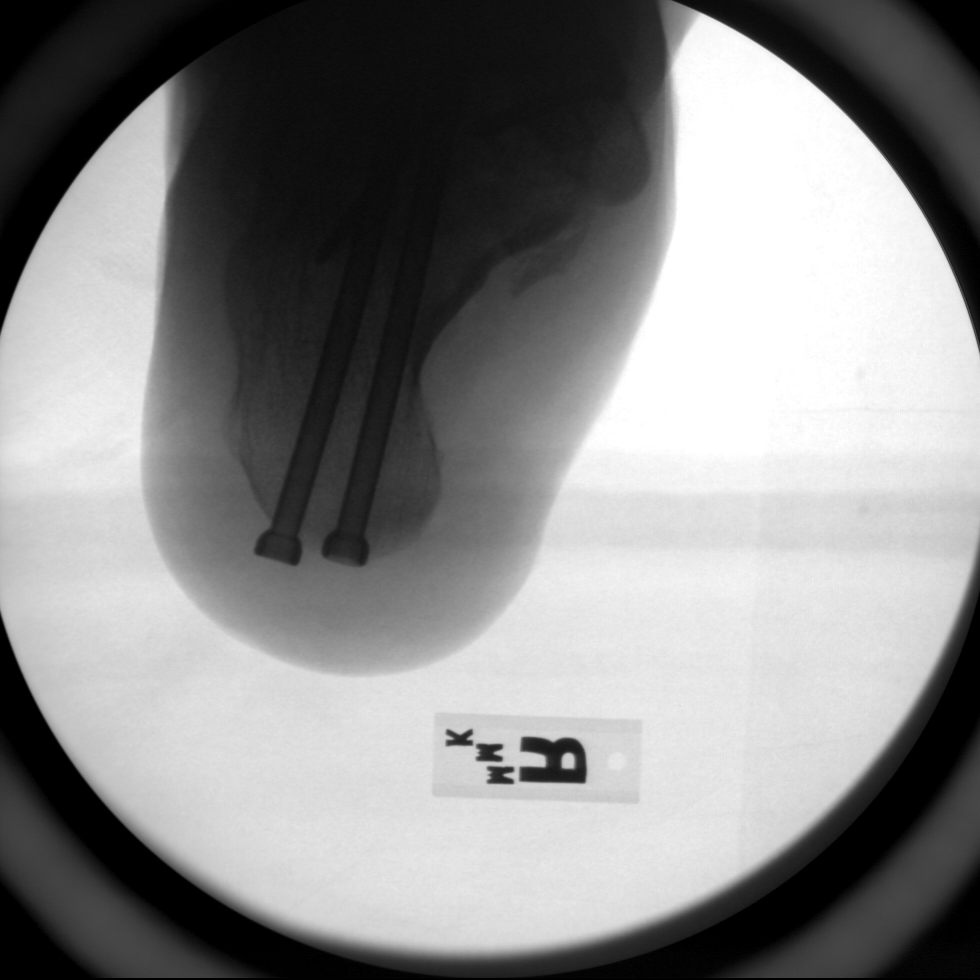
[im 5/6]
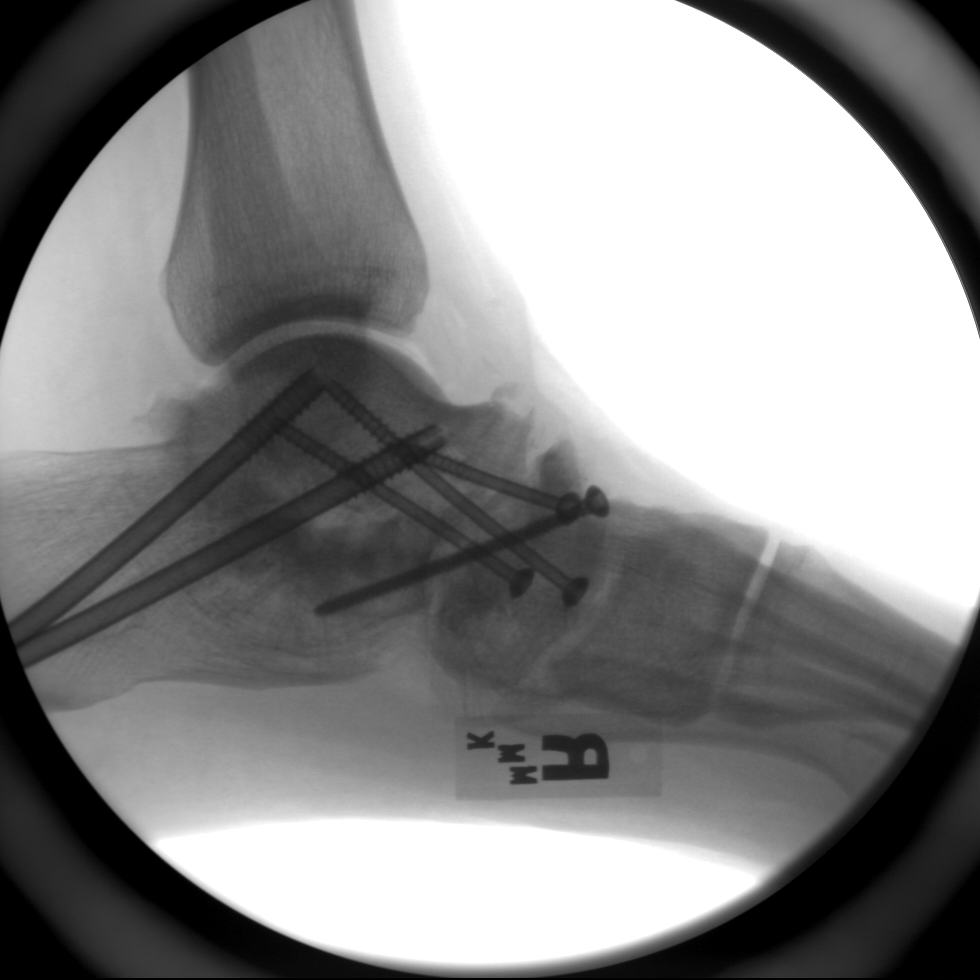
[im 6/6]
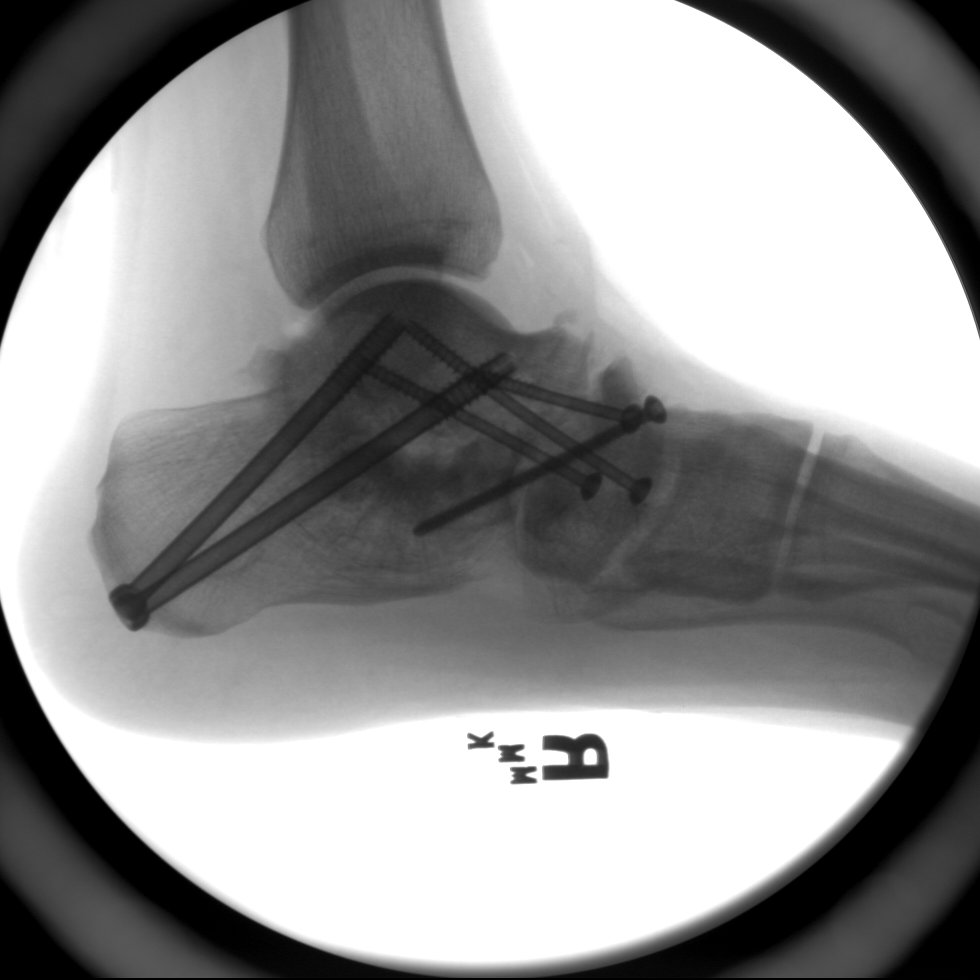

[6 of 6 positions shown; findings below may reference images not displayed]

FINDINGS: Multiple C-arm films document screw placement for right
subtalar and talonavicular arthrodesis.
IMPRESSION: As above.

## 2014-04-10 ENCOUNTER — Emergency Department (INDEPENDENT_AMBULATORY_CARE_PROVIDER_SITE_OTHER)
Admission: EM | Admit: 2014-04-10 | Discharge: 2014-04-10 | Disposition: A | Discharge status: Home / Self Care | Payer: 59 | Source: Home / Self Care | Attending: Emergency Medicine | Admitting: Emergency Medicine

## 2014-04-10 ENCOUNTER — Encounter (HOSPITAL_COMMUNITY): Payer: Self-pay | Admitting: Emergency Medicine

## 2014-04-10 DIAGNOSIS — L255 Unspecified contact dermatitis due to plants, except food: Secondary | ICD-10-CM

## 2014-04-10 MED ORDER — HYDROXYZINE HCL 10 MG PO TABS: 10.0000 mg | ORAL_TABLET | Freq: Three times a day (TID) | ORAL | Status: DC | PRN

## 2014-04-10 MED ORDER — METHYLPREDNISOLONE (PAK) 4 MG PO TABS
ORAL_TABLET | ORAL | Status: DC
Start: 2014-04-10 — End: 2017-03-11

## 2014-04-10 NOTE — ED Notes (Signed)
Rash on arms, face after working in yard over weekend at his mother's home

## 2014-04-10 NOTE — ED Provider Notes (Signed)
CSN: [id number]     Arrival date & time 04/10/14  1830 History   First MD Initiated Contact with Patient 04/10/14 1959     Chief Complaint  Patient presents with  . Rash   (Consider location/radiation/quality/duration/timing/severity/associated sxs/prior Treatment) HPI Comments: Patient states he was helping to clear some brush from his mother's yard 4-11 & 04-07-2014 and has since developed pruritic rash along medial forearms and small patch of rash on his left upper eyelid.  Reports himself to be otherwise healthy. PCP: Dr. Senaida [place]resichardson in [place], [place]  The history is provided by the patient.    Past Medical History  Diagnosis Date  . Arthritis 03/2012    right subtalar navicular arthritis   Past Surgical History  Procedure Laterality Date  . No past surgeries    . Ankle fusion  04/20/2012    Procedure: ARTHRODESIS ANKLE;  Surgeon: Toni ArthursJohn Hewitt, MD;  Location: [facility];  Service: Orthopedics;  Laterality: Right;  RIGHT GASTROC RECESSION, SUBTALAR ARTHRODESIS, TALAR NAVICULAR ARTHRODESIS   History reviewed. No pertinent family history. History  Substance Use Topics  . Smoking status: Never Smoker   . Smokeless tobacco: Never Used  . Alcohol Use: No     Comment: quit drinking 12/2011    Review of Systems  All other systems reviewed and are negative.   Allergies  Review of patient's allergies indicates no known allergies.  Home Medications   Prior to Admission medications   Not on File   BP 144/79  Pulse 72  Temp(Src) 98.5 F (36.9 C) (Oral)  Resp 18  SpO2 98% Physical Exam  Nursing note and vitals reviewed. Constitutional: He is oriented to person, place, and time. He appears well-developed and well-nourished. No distress.  Cardiovascular: Normal rate.   Pulmonary/Chest: Effort normal.  Neurological: He is alert and oriented to person, place, and time.  Skin: Skin is warm and dry.  Patches of vesicular rash on erythematous base, some in linear  distribution on bilateral medial forearms and small 1 cm patch of same on left upper eyelid.   Psychiatric: He has a normal mood and affect. His behavior is normal.    ED Course  Procedures (including critical care time) Labs Review Labs Reviewed - No data to display  Results for orders placed during the hospital encounter of 04/20/12  POCT HEMOGLOBIN-HEMACUE      Result Value Ref Range   Hemoglobin 14.6  13.0 - 17.0 g/dL   Imaging Review No results found.   MDM   1. Contact dermatitis due to plant   Atarax and medrol dose pak as prescribed with PCP follow up if no improvement.     Jess BartersJennifer Lee MorrowPresson, [place] 04/10/14 2027

## 2014-04-10 NOTE — Discharge Instructions (Signed)

## 2014-04-11 NOTE — ED Provider Notes (Signed)
Medical screening examination/treatment/procedure(s) were performed by non-physician practitioner and as supervising physician I was immediately available for consultation/collaboration.  Leslee Homeavid Shavy Beachem, M.D.  Reuben Likesavid C Kawhi Diebold, MD 04/11/14 1440

## 2017-02-08 ENCOUNTER — Ambulatory Visit: Payer: Self-pay | Admitting: Family Medicine

## 2017-03-11 ENCOUNTER — Emergency Department (INDEPENDENT_AMBULATORY_CARE_PROVIDER_SITE_OTHER)
Admission: EM | Admit: 2017-03-11 | Discharge: 2017-03-11 | Disposition: A | Discharge status: Home / Self Care | Payer: Commercial Managed Care - HMO | Source: Home / Self Care | Attending: Family Medicine | Admitting: Family Medicine

## 2017-03-11 ENCOUNTER — Encounter: Payer: Self-pay | Admitting: *Deleted

## 2017-03-11 DIAGNOSIS — B9789 Other viral agents as the cause of diseases classified elsewhere: Secondary | ICD-10-CM | POA: Diagnosis not present

## 2017-03-11 DIAGNOSIS — J069 Acute upper respiratory infection, unspecified: Secondary | ICD-10-CM

## 2017-03-11 MED ORDER — GUAIFENESIN-CODEINE 100-10 MG/5ML PO SOLN: ORAL | 0 refills | Status: DC

## 2017-03-11 MED ORDER — PREDNISONE 20 MG PO TABS: ORAL_TABLET | ORAL | 0 refills | Status: DC

## 2017-03-11 MED ORDER — AZITHROMYCIN 250 MG PO TABS: ORAL_TABLET | ORAL | 0 refills | Status: DC

## 2017-03-11 NOTE — ED Triage Notes (Signed)
Patient c/o 5 days of non-productive cough, nasal congestion and sweats/chills. Taken tylenol cold and flu otc.

## 2017-03-11 NOTE — ED Provider Notes (Signed)
[facility]    CSN: [id number] Arrival date & time: 03/11/17  1034     History   Chief Complaint Chief Complaint  Patient presents with  . Cough  . Nasal Congestion    HPI Ryan Vargas is a 41 y.o. male.   Patient complains of six day history of typical cold-like symptoms developing over several days, including mild sore throat, sinus congestion, headache, fatigue, and cough.  He has a history of seasonal rhinitis.  Family history of asthma (brother).                                                                                                                                                        The history is provided by the patient.    Past Medical History:  Diagnosis Date  . Arthritis 03/2012   right subtalar navicular arthritis    There are no active problems to display for this patient.   Past Surgical History:  Procedure Laterality Date  . ANKLE FUSION  04/20/2012   Procedure: ARTHRODESIS ANKLE;  Surgeon: Toni ArthursJohn Hewitt, MD;  Location: [facility];  Service: Orthopedics;  Laterality: Right;  RIGHT GASTROC RECESSION, SUBTALAR ARTHRODESIS, TALAR NAVICULAR ARTHRODESIS  . NO PAST SURGERIES         Home Medications    Prior to Admission medications   Medication Sig Start Date End Date Taking? Authorizing Provider  azithromycin (ZITHROMAX Z-PAK) 250 MG tablet Take 2 tabs today; then begin one tab once daily for 4 more days. (Rx void after 03/19/17) 03/11/17   Lattie HawStephen A Jabbar Palmero, MD  guaiFENesin-codeine 100-10 MG/5ML syrup Take 10mL by mouth at bedtime as needed for cough 03/11/17   Lattie HawStephen A Lucie Friedlander, MD  predniSONE (DELTASONE) 20 MG tablet Take one tab by mouth twice daily for 4 days, then one daily for 3 days. Take with food. 03/11/17   Lattie HawStephen A Janaye [employer], MD    Family History Family History  Problem Relation Age of Onset  . Diabetes Father     Social History Social History  Substance Use Topics  . Smoking status: Never Smoker  .  Smokeless tobacco: Never Used  . Alcohol use No     Comment: quit drinking 12/2011     Allergies   Patient has no known allergies.   Review of Systems Review of Systems + sore throat + cough No pleuritic pain No wheezing + nasal congestion + post-nasal drainage + sinus pain/pressure No itchy/red eyes No earache No hemoptysis No SOB No fever, + chills No nausea No vomiting No abdominal pain No diarrhea No urinary symptoms No skin rash + fatigue No myalgias + headache Used OTC meds without relief   Physical Exam Triage Vital Signs ED Triage Vitals  Enc Vitals Group     BP 03/11/17 1100 122/77  Pulse Rate 03/11/17 1100 70     Resp 03/11/17 1100 14     Temp 03/11/17 1100 97.7 F (36.5 C)     Temp Source 03/11/17 1100 Oral     SpO2 03/11/17 1100 99 %     Weight 03/11/17 1100 238 lb (108 kg)     Height 03/11/17 1100 6\' 4"  (1.93 m)     Head Circumference --      Peak Flow --      Pain Score 03/11/17 1102 0     Pain Loc --      Pain Edu? --      Excl. in GC? --    No data found.   Updated Vital Signs BP 122/77 (BP Location: Left Arm)   Pulse 70   Temp 97.7 F (36.5 C) (Oral)   Resp 14   Ht 6\' 4"  (1.93 m)   Wt 238 lb (108 kg)   SpO2 99%   BMI 28.97 kg/m   Visual Acuity Right Eye Distance:   Left Eye Distance:   Bilateral Distance:    Right Eye Near:   Left Eye Near:    Bilateral Near:     Physical Exam Nursing notes and Vital Signs reviewed. Appearance:  Patient appears stated age, and in no acute distress Eyes:  Pupils are equal, round, and reactive to light and accomodation.  Extraocular movement is intact.  Conjunctivae are not inflamed  Ears:  Canals normal.  Tympanic membranes normal.  Nose:  Congested turbinates.  No sinus tenderness.    Pharynx:  Normal Neck:  Supple.  Tender enlarged posterior/lateral nodes are palpated bilaterally  Lungs:  Clear to auscultation.  Breath sounds are equal.  Moving air well. Heart:  Regular rate  and rhythm without murmurs, rubs, or gallops.  Abdomen:  Nontender without masses or hepatosplenomegaly.  Bowel sounds are present.  No CVA or flank tenderness.  Extremities:  No edema.  Skin:  No rash present.    UC Treatments / Results  Labs (all labs ordered are listed, but only abnormal results are displayed) Labs Reviewed - No data to display  EKG  EKG Interpretation None       Radiology No results found.  Procedures Procedures (including critical care time)  Medications Ordered in UC Medications - No data to display   Initial Impression / Assessment and Plan / UC Course  I have reviewed the triage vital signs and the nursing notes.  Pertinent labs & imaging results that were available during my care of the patient were reviewed by me and considered in my medical decision making (see chart for details).    There is no evidence of bacterial infection today.   With his history of seasonal rhinitis and family history of asthma, will begin prednisone burst/taper. Rx for Robitussin AC for night time cough.  Take plain guaifenesin (1200mg  extended release tabs such as Mucinex) twice daily, with plenty of water, for cough and congestion.  May add Pseudoephedrine (30mg , one or two every 4 to 6 hours) for sinus congestion.  Get adequate rest.   May use Afrin nasal spray (or generic oxymetazoline) each morning for about 5 days and then discontinue.  Also recommend using saline nasal spray several times daily and saline nasal irrigation (AYR is a common brand).  Use Flonase nasal spray each morning after using Afrin nasal spray and saline nasal irrigation. Try warm salt water gargles for sore throat.  Stop all antihistamines for now, and other non-prescription  cough/cold preparations. Begin Azithromycin if not improving about one week or if persistent fever develops (Given a prescription to hold, with an expiration date)  Follow-up with family doctor if not improving about10 days.       Final Clinical Impressions(s) / UC Diagnoses   Final diagnoses:  Viral URI with cough    New Prescriptions New Prescriptions   AZITHROMYCIN (ZITHROMAX Z-PAK) 250 MG TABLET    Take 2 tabs today; then begin one tab once daily for 4 more days. (Rx void after 03/19/17)   GUAIFENESIN-CODEINE 100-10 MG/5ML SYRUP    Take 10mL by mouth at bedtime as needed for cough   PREDNISONE (DELTASONE) 20 MG TABLET    Take one tab by mouth twice daily for 4 days, then one daily for 3 days. Take with food.     Lattie Haw, MD 03/11/17 [phone]

## 2017-03-11 NOTE — Discharge Instructions (Signed)
Take plain guaifenesin (1200mg extended release tabs such as Mucinex) twice daily, with plenty of water, for cough and congestion.  May add Pseudoephedrine (30mg, one or two every 4 to 6 hours) for sinus congestion.  Get adequate rest.   °May use Afrin nasal spray (or generic oxymetazoline) each morning for about 5 days and then discontinue.  Also recommend using saline nasal spray several times daily and saline nasal irrigation (AYR is a common brand).  Use Flonase nasal spray each morning after using Afrin nasal spray and saline nasal irrigation. °Try warm salt water gargles for sore throat.  °Stop all antihistamines for now, and other non-prescription cough/cold preparations. °Begin Azithromycin if not improving about one week or if persistent fever develops (Given a prescription to hold, with an expiration date)  °Follow-up with family doctor if not improving about10 days.  °

## 2017-06-07 ENCOUNTER — Encounter: Payer: Self-pay | Admitting: Family Medicine

## 2017-06-07 ENCOUNTER — Ambulatory Visit (INDEPENDENT_AMBULATORY_CARE_PROVIDER_SITE_OTHER): Payer: Commercial Managed Care - HMO | Admitting: Family Medicine

## 2017-06-07 VITALS — BP 125/69 | HR 71 | Ht 73.66 in | Wt 252.0 lb

## 2017-06-07 DIAGNOSIS — Z0000 Encounter for general adult medical examination without abnormal findings: Secondary | ICD-10-CM

## 2017-06-07 DIAGNOSIS — H5440 Blindness, one eye, unspecified eye: Secondary | ICD-10-CM | POA: Insufficient documentation

## 2017-06-07 NOTE — Progress Notes (Signed)
Subjective:    Patient ID: Ryan Vargas, male    DOB: 1976/10/09, 41 y.o.   MRN: [id number]  HPI 41 year old male comes in today to establish care. His wife it's been a long-term patient here. He is interested in checking his overall health. He does have a family history of diabetes. His only surgical history is foot surgery back in 2012 any had reconstruction done on that right foot. He is not currently exercising.  He has gained some weight recently which she is not happy about that he hasn't been able to exercise as regularly as he used to especially with having 4 kids. Who are still relatively small.   Review of Systems  Constitutional: Negative for diaphoresis, fever and unexpected weight change.  HENT: Negative for hearing loss, rhinorrhea, sneezing and tinnitus.   Eyes: Negative for visual disturbance.  Respiratory: Negative for cough and wheezing.   Cardiovascular: Negative for chest pain and palpitations.  Gastrointestinal: Negative for blood in stool, diarrhea, nausea and vomiting.  Genitourinary: Negative for discharge and dysuria.  Musculoskeletal: Negative for arthralgias and myalgias.  Skin: Negative for rash.  Neurological: Negative for headaches.  Hematological: Negative for adenopathy.  Psychiatric/Behavioral: Negative for dysphoric mood and sleep disturbance. The patient is not nervous/anxious.    BP 125/69   Pulse 71   Ht 6' 1.66" (1.871 m)   Wt 252 lb (114.3 kg)   SpO2 100%   BMI 32.65 kg/m     Allergies  Allergen Reactions  . Pollen Extract     Past Medical History:  Diagnosis Date  . Arthritis 03/2012   right subtalar navicular arthritis    Past Surgical History:  Procedure Laterality Date  . ANKLE FUSION  04/20/2012   Procedure: ARTHRODESIS ANKLE;  Surgeon: Toni Arthurs, MD;  Location: [facility];  Service: Orthopedics;  Laterality: Right;  RIGHT GASTROC RECESSION, SUBTALAR ARTHRODESIS, TALAR NAVICULAR ARTHRODESIS    Social History    Social History  . Marital status: Married    Spouse name: Summer  . Number of children: 4  . Years of education: some college   Occupational History  . Not on file.   Social History Main Topics  . Smoking status: Never Smoker  . Smokeless tobacco: Never Used  . Alcohol use No     Comment: quit drinking 12/2011  . Drug use: No  . Sexual activity: Yes    Partners: Female   Other Topics Concern  . Not on file   Social History Narrative   Some exercise. 1 caffeinated drink daily.    Family History  Problem Relation Age of Onset  . Diabetes Father   . Hypertension Father     Outpatient Encounter Prescriptions as of 06/07/2017  Medication Sig  . [DISCONTINUED] azithromycin (ZITHROMAX Z-PAK) 250 MG tablet Take 2 tabs today; then begin one tab once daily for 4 more days. (Rx void after 03/19/17)  . [DISCONTINUED] guaiFENesin-codeine 100-10 MG/5ML syrup Take 10mL by mouth at bedtime as needed for cough  . [DISCONTINUED] predniSONE (DELTASONE) 20 MG tablet Take one tab by mouth twice daily for 4 days, then one daily for 3 days. Take with food.   No facility-administered encounter medications on file as of 06/07/2017.         Objective:   Physical Exam  Constitutional: He is oriented to person, place, and time. He appears well-developed and well-nourished.  HENT:  Head: Normocephalic and atraumatic.  Right Ear: External ear normal.  Left Ear: External  ear normal.  Nose: Nose normal.  Mouth/Throat: Oropharynx is clear and moist.  Eyes: Conjunctivae and EOM are normal. Pupils are equal, round, and reactive to light.  Neck: Normal range of motion. Neck supple. No thyromegaly present.  Cardiovascular: Normal rate, regular rhythm, normal heart sounds and intact distal pulses.   Pulmonary/Chest: Effort normal and breath sounds normal.  Abdominal: Soft. Bowel sounds are normal. He exhibits no distension and no mass. There is no tenderness. There is no rebound and no guarding.   Musculoskeletal: Normal range of motion.  Lymphadenopathy:    He has no cervical adenopathy.  Neurological: He is alert and oriented to person, place, and time. He has normal reflexes.  Skin: Skin is warm and dry.  Psychiatric: He has a normal mood and affect. His behavior is normal. Judgment and thought content normal.        Assessment & Plan:  CPE Keep up a regular exercise program and make sure you are eating a healthy diet Try to eat 4 servings of dairy a day, or if you are lactose intolerant take a calcium with vitamin D daily.  Your vaccines are up to date.

## 2017-06-07 NOTE — Patient Instructions (Addendum)
Preventive Care 41-64 Years, Male Preventive care refers to lifestyle choices and visits with your health care provider that can promote health and wellness. What does preventive care include?  A yearly physical exam. This is also called an annual well check.  Dental exams once or twice a year.  Routine eye exams. Ask your health care provider how often you should have your eyes checked.  Personal lifestyle choices, including: ? Daily care of your teeth and gums. ? Regular physical activity. ? Eating a healthy diet. ? Avoiding tobacco and drug use. ? Limiting alcohol use. ? Practicing safe sex. ? Taking low-dose aspirin every day starting at age 41. What happens during an annual well check? The services and screenings done by your health care provider during your annual well check will depend on your age, overall health, lifestyle risk factors, and family history of disease. Counseling Your health care provider may ask you questions about your:  Alcohol use.  Tobacco use.  Drug use.  Emotional well-being.  Home and relationship well-being.  Sexual activity.  Eating habits.  Work and work Statistician.  Screening You may have the following tests or measurements:  Height, weight, and BMI.  Blood pressure.  Lipid and cholesterol levels. These may be checked every 5 years, or more frequently if you are over 41 years old.  Skin check.  Lung cancer screening. You may have this screening every year starting at age 41 if you have a 30-pack-year history of smoking and currently smoke or have quit within the past 15 years.  Fecal occult blood test (FOBT) of the stool. You may have this test every year starting at age 41.  Flexible sigmoidoscopy or colonoscopy. You may have a sigmoidoscopy every 5 years or a colonoscopy every 10 years starting at age 41.  Prostate cancer screening. Recommendations will vary depending on your family history and other risks.  Hepatitis C  blood test.  Hepatitis B blood test.  Sexually transmitted disease (STD) testing.  Diabetes screening. This is done by checking your blood sugar (glucose) after you have not eaten for a while (fasting). You may have this done every 1-3 years.  Discuss your test results, treatment options, and if necessary, the need for more tests with your health care provider. Vaccines Your health care provider may recommend certain vaccines, such as:  Influenza vaccine. This is recommended every year.  Tetanus, diphtheria, and acellular pertussis (Tdap, Td) vaccine. You may need a Td booster every 10 years.  Varicella vaccine. You may need this if you have not been vaccinated.  Zoster vaccine. You may need this after age 79.  Measles, mumps, and rubella (MMR) vaccine. You may need at least one dose of MMR if you were born in 1957 or later. You may also need a second dose.  Pneumococcal 13-valent conjugate (PCV13) vaccine. You may need this if you have certain conditions and have not been vaccinated.  Pneumococcal polysaccharide (PPSV23) vaccine. You may need one or two doses if you smoke cigarettes or if you have certain conditions.  Meningococcal vaccine. You may need this if you have certain conditions.  Hepatitis A vaccine. You may need this if you have certain conditions or if you travel or work in places where you may be exposed to hepatitis A.  Hepatitis B vaccine. You may need this if you have certain conditions or if you travel or work in places where you may be exposed to hepatitis B.  Haemophilus influenzae type b (Hib) vaccine.  You may need this if you have certain risk factors.  Talk to your health care provider about which screenings and vaccines you need and how often you need them. This information is not intended to replace advice given to you by your health care provider. Make sure you discuss any questions you have with your health care provider. Document Released: 01/09/2016  Document Revised: 09/01/2016 Document Reviewed: 10/14/2015 Elsevier Interactive Patient Education  2017 Elsevier Inc.  

## 2017-06-08 ENCOUNTER — Encounter: Payer: Self-pay | Admitting: Family Medicine

## 2017-06-08 DIAGNOSIS — E781 Pure hyperglyceridemia: Secondary | ICD-10-CM | POA: Insufficient documentation

## 2017-06-08 LAB — COMPLETE METABOLIC PANEL WITH GFR
ALBUMIN: 4 g/dL (ref 3.6–5.1)
ALT: 31 U/L (ref 9–46)
AST: 27 U/L (ref 10–40)
Alkaline Phosphatase: 74 U/L (ref 40–115)
BILIRUBIN TOTAL: 0.5 mg/dL (ref 0.2–1.2)
BUN: 10 mg/dL (ref 7–25)
CO2: 22 mmol/L (ref 20–31)
CREATININE: 1.07 mg/dL (ref 0.60–1.35)
Calcium: 9.2 mg/dL (ref 8.6–10.3)
Chloride: 105 mmol/L (ref 98–110)
GFR, Est Non African American: 86 mL/min (ref >=60)
GLUCOSE: 102 mg/dL — AB (ref 65–99)
Potassium: 4.1 mmol/L (ref 3.5–5.3)
Sodium: 138 mmol/L (ref 135–146)
TOTAL PROTEIN: 6.9 g/dL (ref 6.1–8.1)

## 2017-06-08 LAB — CBC
HCT: 41.3 % (ref 38.5–50.0)
Hemoglobin: 14 g/dL (ref 13.2–17.1)
MCH: 30.8 pg (ref 27.0–33.0)
MCHC: 33.9 g/dL (ref 32.0–36.0)
MCV: 90.8 fL (ref 80.0–100.0)
MPV: 10.1 fL (ref 7.5–12.5)
PLATELETS: 329 10*3/uL (ref 140–400)
RBC: 4.55 MIL/uL (ref 4.20–5.80)
RDW: 13.7 % (ref 11.0–15.0)
WBC: 5.4 10*3/uL (ref 3.8–10.8)

## 2017-06-08 LAB — LIPID PANEL W/REFLEX DIRECT LDL
Cholesterol: 161 mg/dL (ref <200)
HDL: 33 mg/dL — ABNORMAL LOW (ref >40)
Non-HDL Cholesterol (Calc): 128 mg/dL (ref <130)
TRIGLYCERIDES: 564 mg/dL — AB (ref <150)
Total CHOL/HDL Ratio: 4.9 Ratio (ref <5.0)

## 2017-06-08 LAB — TSH: TSH: 2.04 m[IU]/L (ref 0.40–4.50)

## 2017-06-08 LAB — LDL CHOLESTEROL, DIRECT: LDL DIRECT: 45 mg/dL (ref <100)

## 2017-06-15 ENCOUNTER — Telehealth: Payer: Self-pay | Admitting: Physician Assistant

## 2017-06-15 MED ORDER — PITAVASTATIN CALCIUM 2 MG PO TABS: 2.0000 mg | ORAL_TABLET | Freq: Every day | ORAL | 5 refills | Status: DC

## 2017-06-15 NOTE — Telephone Encounter (Signed)
I sent livalo 2g once daily to pharmacy. Recheck levels in 3-4 months.

## 2017-06-15 NOTE — Telephone Encounter (Signed)
Pt's wife advised, verbalized understanding.

## 2017-06-15 NOTE — Telephone Encounter (Signed)
Based on most recent labs, Pt would like to start Livalo. Routing to Provider in office for review and dosage.

## 2017-06-21 NOTE — Telephone Encounter (Signed)
livalo not covered under Pt's insurance. Routing to Provider in office for review.

## 2017-06-22 MED ORDER — OMEGA-3-ACID ETHYL ESTERS 1 G PO CAPS: 2.0000 g | ORAL_CAPSULE | Freq: Two times a day (BID) | ORAL | 11 refills | Status: DC

## 2017-06-22 NOTE — Telephone Encounter (Addendum)
I will send over new Rx for Lovaza and see if insurance will cover.

## 2017-06-22 NOTE — Addendum Note (Signed)
Addended by: Nani GasserMETHENEY, CATHERINE D on: 06/22/2017 04:18 PM   Modules accepted: Orders

## 2017-06-22 NOTE — Telephone Encounter (Signed)
Looks like you saw this person!

## 2017-09-18 ENCOUNTER — Emergency Department (HOSPITAL_COMMUNITY)
Admission: EM | Admit: 2017-09-18 | Discharge: 2017-09-18 | Disposition: A | Discharge status: Home / Self Care | Payer: Commercial Managed Care - HMO | Attending: Emergency Medicine | Admitting: Emergency Medicine

## 2017-09-18 ENCOUNTER — Encounter (HOSPITAL_COMMUNITY): Payer: Self-pay | Admitting: *Deleted

## 2017-09-18 DIAGNOSIS — Y33XXXA Other specified events, undetermined intent, initial encounter: Secondary | ICD-10-CM | POA: Insufficient documentation

## 2017-09-18 DIAGNOSIS — Y998 Other external cause status: Secondary | ICD-10-CM | POA: Insufficient documentation

## 2017-09-18 DIAGNOSIS — Y939 Activity, unspecified: Secondary | ICD-10-CM | POA: Insufficient documentation

## 2017-09-18 DIAGNOSIS — S6991XA Unspecified injury of right wrist, hand and finger(s), initial encounter: Secondary | ICD-10-CM

## 2017-09-18 DIAGNOSIS — S60021A Contusion of right index finger without damage to nail, initial encounter: Secondary | ICD-10-CM | POA: Diagnosis not present

## 2017-09-18 DIAGNOSIS — Y929 Unspecified place or not applicable: Secondary | ICD-10-CM | POA: Insufficient documentation

## 2017-09-18 NOTE — ED Provider Notes (Signed)
[facility]PT Provider Note   CSN: [id number] Arrival date & time: 09/18/17  4098     History   Chief Complaint Chief Complaint  Patient presents with  . Finger Injury    HPI Ryan Vargas is a 41 y.o. male presents emergency Department with chief complaint of right finger pain. Patient states that he injured his right index finger under a pallet of wood 2 days ago. He states that yesterday his finger was swollen and throbbing with mild bruising at the distal end. He denies any injury to the nail bed. He used ice and Tylenol. He is right-hand dominant  HPI  Past Medical History:  Diagnosis Date  . Arthritis 03/2012   right subtalar navicular arthritis    Patient Active Problem List   Diagnosis Date Noted  . Hypertriglyceridemia, familial 06/08/2017  . Blind left eye 06/07/2017    Past Surgical History:  Procedure Laterality Date  . ANKLE FUSION  04/20/2012   Procedure: ARTHRODESIS ANKLE;  Surgeon: Toni Arthurs, MD;  Location: [facility];  Service: Orthopedics;  Laterality: Right;  RIGHT GASTROC RECESSION, SUBTALAR ARTHRODESIS, TALAR NAVICULAR ARTHRODESIS       Home Medications    Prior to Admission medications   Medication Sig Start Date End Date Taking? Authorizing Provider  omega-3 acid ethyl esters (LOVAZA) 1 g capsule Take 2 capsules (2 g total) by mouth 2 (two) times daily. 06/22/17   Agapito Games, MD    Family History Family History  Problem Relation Age of Onset  . Diabetes Father   . Hypertension Father     Social History Social History  Substance Use Topics  . Smoking status: Never Smoker  . Smokeless tobacco: Never Used  . Alcohol use No     Comment: quit drinking 12/2011     Allergies   Pollen extract   Review of Systems Review of Systems  Musculoskeletal: Positive for arthralgias and joint swelling.  Skin: Negative for wound.  Neurological: Negative for weakness and numbness.    Marland Kitchen   Physical  Exam Updated Vital Signs BP 127/79 (BP Location: Left Arm)   Pulse 75   Temp 98.3 F (36.8 C) (Oral)   Resp 12   Ht  (1.93 m)   Wt 108.9 kg (240 lb)   SpO2 100%   BMI 29.21 kg/m   Physical Exam  Constitutional: He appears well-developed and well-nourished. No distress.  HENT:  Head: Normocephalic and atraumatic.  Eyes: Conjunctivae are normal. No scleral icterus.  Neck: Normal range of motion. Neck supple.  Cardiovascular: Normal rate, regular rhythm and normal heart sounds.   Pulmonary/Chest: Effort normal and breath sounds normal. No respiratory distress.  Abdominal: Soft. There is no tenderness.  Musculoskeletal: He exhibits no edema.  Mild bruising at the distal end of the right finger. Able to move the DIP and PIP and MCP joint without significant discomfort or pain. No evidence of nail bed injury.  Neurological: He is alert.  Skin: Skin is warm and dry. He is not diaphoretic.  Psychiatric: His behavior is normal.  Nursing note and vitals reviewed.    ED Treatments / Results  Labs (all labs ordered are listed, but only abnormal results are displayed) Labs Reviewed - No data to display  EKG  EKG Interpretation None       Radiology No results found.  Procedures Procedures (including critical care time)  Medications Ordered in ED Medications - No data to display   Initial Impression /  Assessment and Plan / ED Course  I have reviewed the triage vital signs and the nursing notes.  Pertinent labs & imaging results that were available during my care of the patient were reviewed by me and considered in my medical decision making (see chart for details).     Patient with right finger injury, able to move every joint. Potential for distal tuft fracture however this will not change management. No nail bed injury or subungual hematoma. Cap refill less than 2 seconds. Patient placed in finger splint. Given a refill hand follow-up. He appears safe for  discharge at this  Final Clinical Impressions(s) / ED Diagnoses   Final diagnoses:  Injury of finger of right hand, initial encounter    New Prescriptions Discharge Medication List as of 09/18/2017  9:43 AM       Arthor Captain, PA-C 09/18/17 1008    Raeford Razor, MD 09/18/17 1751

## 2017-09-18 NOTE — ED Triage Notes (Signed)
Pt states hurt on job 2 days ago and states pallet ran over his left finger and it was bruised and it started throbbing yesterday.

## 2017-09-18 NOTE — Discharge Instructions (Signed)
Apply ice to the affected injury, protect the area of the finger by wearing the finger splint. Use Motrin or Tylenol for pain. Follow-up with Dr. Jena Gauss a few continue to have pain or your symptoms are not improving.

## 2017-09-18 NOTE — ED Notes (Signed)
ED Provider at bedside. 

## 2018-07-03 ENCOUNTER — Other Ambulatory Visit: Payer: Self-pay | Admitting: Family Medicine

## 2018-08-10 ENCOUNTER — Encounter: Payer: Self-pay | Admitting: Family Medicine

## 2018-08-10 ENCOUNTER — Ambulatory Visit (INDEPENDENT_AMBULATORY_CARE_PROVIDER_SITE_OTHER): Payer: 59 | Admitting: Family Medicine

## 2018-08-10 VITALS — BP 124/71 | HR 67 | Ht 74.0 in | Wt 274.0 lb

## 2018-08-10 DIAGNOSIS — Z0000 Encounter for general adult medical examination without abnormal findings: Secondary | ICD-10-CM | POA: Diagnosis not present

## 2018-08-10 NOTE — Progress Notes (Signed)
Subjective:    Patient ID: Ryan GuileKenton D Vargas, male    DOB: 06/29/1976, 42 y.o.   MRN: [id number]  HPI 42 yo male is here for CPE.    Review of Systems  Comprehensive ROS is negative except for HPI  There were no vitals taken for this visit.    Allergies  Allergen Reactions  . Pollen Extract     Past Medical History:  Diagnosis Date  . Arthritis 03/2012   right subtalar navicular arthritis    Past Surgical History:  Procedure Laterality Date  . ANKLE FUSION  04/20/2012   Procedure: ARTHRODESIS ANKLE;  Surgeon: Toni ArthursJohn Hewitt, MD;  Location: [facility];  Service: Orthopedics;  Laterality: Right;  RIGHT GASTROC RECESSION, SUBTALAR ARTHRODESIS, TALAR NAVICULAR ARTHRODESIS    Social History   Socioeconomic History  . Marital status: Married    Spouse name: Summer  . Number of children: 4  . Years of education: some college  . Highest education level: Not on file  Occupational History  . Not on file  Social Needs  . Financial resource strain: Not on file  . Food insecurity:    Worry: Not on file    Inability: Not on file  . Transportation needs:    Medical: Not on file    Non-medical: Not on file  Tobacco Use  . Smoking status: Never Smoker  . Smokeless tobacco: Never Used  Substance and Sexual Activity  . Alcohol use: No    Comment: quit drinking 12/2011  . Drug use: No  . Sexual activity: Yes    Partners: Female  Lifestyle  . Physical activity:    Days per week: Not on file    Minutes per session: Not on file  . Stress: Not on file  Relationships  . Social connections:    Talks on phone: Not on file    Gets together: Not on file    Attends religious service: Not on file    Active member of club or organization: Not on file    Attends meetings of clubs or organizations: Not on file    Relationship status: Not on file  . Intimate partner violence:    Fear of current or ex partner: Not on file    Emotionally abused: Not on file    Physically  abused: Not on file    Forced sexual activity: Not on file  Other Topics Concern  . Not on file  Social History Narrative   Some exercise. 1 caffeinated drink daily.    Family History  Problem Relation Age of Onset  . Diabetes Father   . Hypertension Father     Outpatient Encounter Medications as of 08/10/2018  Medication Sig  . omega-3 acid ethyl esters (LOVAZA) 1 g capsule Take 2 capsules (2 g total) by mouth 2 (two) times daily.   No facility-administered encounter medications on file as of 08/10/2018.           Objective:   Physical Exam  Constitutional: He is oriented to person, place, and time. He appears well-developed and well-nourished.  HENT:  Head: Normocephalic and atraumatic.  Right Ear: External ear normal.  Left Ear: External ear normal.  Nose: Nose normal.  Mouth/Throat: Oropharynx is clear and moist.  Eyes: Pupils are equal, round, and reactive to light. Conjunctivae and EOM are normal.  Neck: Normal range of motion. Neck supple. No thyromegaly present.  Cardiovascular: Normal rate, regular rhythm, normal heart sounds and intact distal pulses.  Pulmonary/Chest:  Effort normal and breath sounds normal.  Abdominal: Soft. Bowel sounds are normal. He exhibits no distension and no mass. There is no tenderness. There is no rebound and no guarding.  Musculoskeletal: Normal range of motion.  Lymphadenopathy:    He has no cervical adenopathy.  Neurological: He is alert and oriented to person, place, and time. He has normal reflexes.  Skin: Skin is warm and dry.  Psychiatric: He has a normal mood and affect. His behavior is normal. Judgment and thought content normal.        Assessment & Plan:  CPE -  Keep up a regular exercise program and make sure you are eating a healthy diet Try to eat 4 servings of dairy a day, or if you are lactose intolerant take a calcium with vitamin D daily.  Your vaccines are up to date.

## 2018-08-10 NOTE — Patient Instructions (Signed)

## 2018-08-11 ENCOUNTER — Other Ambulatory Visit: Payer: Self-pay | Admitting: *Deleted

## 2018-08-11 ENCOUNTER — Other Ambulatory Visit: Payer: Self-pay | Admitting: Family Medicine

## 2018-08-11 DIAGNOSIS — R748 Abnormal levels of other serum enzymes: Secondary | ICD-10-CM

## 2018-08-11 LAB — COMPLETE METABOLIC PANEL WITH GFR
AG RATIO: 1.5 (calc) (ref 1.0–2.5)
ALT: 51 U/L — ABNORMAL HIGH (ref 9–46)
AST: 36 U/L (ref 10–40)
Albumin: 4.2 g/dL (ref 3.6–5.1)
Alkaline phosphatase (APISO): 71 U/L (ref 40–115)
BUN: 11 mg/dL (ref 7–25)
CALCIUM: 9.3 mg/dL (ref 8.6–10.3)
CO2: 23 mmol/L (ref 20–32)
Chloride: 105 mmol/L (ref 98–110)
Creat: 1.25 mg/dL (ref 0.60–1.35)
GFR, EST NON AFRICAN AMERICAN: 71 mL/min/{1.73_m2} (ref >=60)
GFR, Est African American: 82 mL/min/{1.73_m2} (ref >=60)
GLOBULIN: 2.8 g/dL (ref 1.9–3.7)
Glucose, Bld: 109 mg/dL — ABNORMAL HIGH (ref 65–99)
POTASSIUM: 4 mmol/L (ref 3.5–5.3)
SODIUM: 137 mmol/L (ref 135–146)
Total Bilirubin: 0.5 mg/dL (ref 0.2–1.2)
Total Protein: 7 g/dL (ref 6.1–8.1)

## 2018-08-11 LAB — CBC
HEMATOCRIT: 42.1 % (ref 38.5–50.0)
HEMOGLOBIN: 14.2 g/dL (ref 13.2–17.1)
MCH: 30 pg (ref 27.0–33.0)
MCHC: 33.7 g/dL (ref 32.0–36.0)
MCV: 88.8 fL (ref 80.0–100.0)
MPV: 10.5 fL (ref 7.5–12.5)
PLATELETS: 329 10*3/uL (ref 140–400)
RBC: 4.74 10*6/uL (ref 4.20–5.80)
RDW: 13.2 % (ref 11.0–15.0)
WBC: 4.6 10*3/uL (ref 3.8–10.8)

## 2018-08-11 LAB — LIPID PANEL
Cholesterol: 173 mg/dL (ref <200)
HDL: 34 mg/dL — AB (ref >40)
NON-HDL CHOLESTEROL (CALC): 139 mg/dL — AB (ref <130)
Total CHOL/HDL Ratio: 5.1 (calc) — ABNORMAL HIGH (ref <5.0)
Triglycerides: 485 mg/dL — ABNORMAL HIGH (ref <150)

## 2018-08-11 LAB — HEMOGLOBIN A1C
EAG (MMOL/L): 7 (calc)
Hgb A1c MFr Bld: 6 % of total Hgb — ABNORMAL HIGH (ref <5.7)
Mean Plasma Glucose: 126 (calc)

## 2018-08-11 MED ORDER — OMEGA-3-ACID ETHYL ESTERS 1 G PO CAPS: 2.0000 g | ORAL_CAPSULE | Freq: Two times a day (BID) | ORAL | 11 refills | Status: DC

## 2019-02-06 ENCOUNTER — Ambulatory Visit (INDEPENDENT_AMBULATORY_CARE_PROVIDER_SITE_OTHER): Payer: 59 | Admitting: Family Medicine

## 2019-02-06 ENCOUNTER — Encounter: Payer: Self-pay | Admitting: Family Medicine

## 2019-02-06 VITALS — BP 124/86 | HR 51 | Ht 74.0 in | Wt 276.0 lb

## 2019-02-06 DIAGNOSIS — R7301 Impaired fasting glucose: Secondary | ICD-10-CM | POA: Diagnosis not present

## 2019-02-06 DIAGNOSIS — E781 Pure hyperglyceridemia: Secondary | ICD-10-CM | POA: Diagnosis not present

## 2019-02-06 DIAGNOSIS — R7309 Other abnormal glucose: Secondary | ICD-10-CM | POA: Diagnosis not present

## 2019-02-06 DIAGNOSIS — R748 Abnormal levels of other serum enzymes: Secondary | ICD-10-CM | POA: Diagnosis not present

## 2019-02-06 LAB — POCT GLYCOSYLATED HEMOGLOBIN (HGB A1C): Hemoglobin A1C: 5.9 % — AB (ref 4.0–5.6)

## 2019-02-06 NOTE — Progress Notes (Signed)
Subjective:    CC:   HPI:  Her for f/u Abnormal glucose.  Last A1c was 6.0 in August in the prediabetes range.  He admits that he struggles with eating consistently because of his work schedule.  He has been working out and going to the gym and trying to eat better.  Also here to follow-up for elevated liver enzymes.  When he came in for his physical in August his liver enzymes were mildly elevated.  And we had recommended avoiding Tylenol and alcohol products and then planning to recheck in 1 month.  He did not return for that blood work.  He says he did cut back on Tylenol and says he really only rarely drinks alcohol anyway.  Also had elevated triglyceride levels as well.  Would just encouraged him to make sure that he was actually taking his Lovaza.  He says is been trying to be more consistent but being that he does work third shift he sometimes forgets to take his medication he thinks that may have been what happened in August.  Past medical history, Surgical history, Family history not pertinant except as noted below, Social history, Allergies, and medications have been entered into the medical record, reviewed, and corrections made.   Review of Systems: No fevers, chills, night sweats, weight loss, chest pain, or shortness of breath.   Objective:    General: Well Developed, well nourished, and in no acute distress.  Neuro: Alert and oriented x3, extra-ocular muscles intact, sensation grossly intact.  HEENT: Normocephalic, atraumatic  Skin: Warm and dry, no rashes. Cardiac: Regular rate and rhythm, no murmurs rubs or gallops, no lower extremity edema.  Respiratory: Clear to auscultation bilaterally. Not using accessory muscles, speaking in full sentences.   Impression and Recommendations:    Impaired fasting glucose-hemoglobin A1c of 5.9 today so just slight improvement from previous 6 months ago which is fantastic.  Encouraged him just to continue work on healthy diet and regular  exercise and weight loss.  Urged him to work on cutting back on sugary beverages.  Elevated liver enzymes-plan to recheck liver enzymes.  Explained that if it still elevated then we will plan to get an ultrasound and hepatitis panel for further work-up common causes include Tylenol, alcohol, fatty liver disease etc.  Hypertriglyceridemia-I would like to recheck his triglycerides now that he has been taking his Lovaza little bit more consistently.  But he is not fasting today so he will plan to do that on a another day.  He just has a hard time getting here bc of his job schedule.

## 2019-02-07 ENCOUNTER — Ambulatory Visit: Payer: 59 | Admitting: Family Medicine

## 2019-02-07 LAB — COMPLETE METABOLIC PANEL WITH GFR
AG Ratio: 1.7 (calc) (ref 1.0–2.5)
ALBUMIN MSPROF: 4.3 g/dL (ref 3.6–5.1)
ALKALINE PHOSPHATASE (APISO): 71 U/L (ref 36–130)
ALT: 36 U/L (ref 9–46)
AST: 26 U/L (ref 10–40)
BUN: 10 mg/dL (ref 7–25)
CALCIUM: 9.6 mg/dL (ref 8.6–10.3)
CO2: 25 mmol/L (ref 20–32)
CREATININE: 1.11 mg/dL (ref 0.60–1.35)
Chloride: 103 mmol/L (ref 98–110)
GFR, EST NON AFRICAN AMERICAN: 81 mL/min/{1.73_m2} (ref >=60)
GFR, Est African American: 94 mL/min/{1.73_m2} (ref >=60)
GLOBULIN: 2.5 g/dL (ref 1.9–3.7)
GLUCOSE: 143 mg/dL — AB (ref 65–139)
Potassium: 4 mmol/L (ref 3.5–5.3)
SODIUM: 136 mmol/L (ref 135–146)
Total Bilirubin: 0.5 mg/dL (ref 0.2–1.2)
Total Protein: 6.8 g/dL (ref 6.1–8.1)

## 2019-08-14 ENCOUNTER — Encounter: Payer: 59 | Admitting: Family Medicine

## 2020-12-13 ENCOUNTER — Ambulatory Visit (HOSPITAL_COMMUNITY)
Admission: EM | Admit: 2020-12-13 | Discharge: 2020-12-13 | Disposition: A | Discharge status: Home / Self Care | Payer: BC Managed Care – PPO | Attending: Emergency Medicine | Admitting: Emergency Medicine

## 2020-12-13 ENCOUNTER — Other Ambulatory Visit: Payer: Self-pay

## 2020-12-13 ENCOUNTER — Encounter (HOSPITAL_COMMUNITY): Payer: Self-pay

## 2020-12-13 DIAGNOSIS — R059 Cough, unspecified: Secondary | ICD-10-CM | POA: Diagnosis not present

## 2020-12-13 DIAGNOSIS — H5462 Unqualified visual loss, left eye, normal vision right eye: Secondary | ICD-10-CM | POA: Insufficient documentation

## 2020-12-13 DIAGNOSIS — M1990 Unspecified osteoarthritis, unspecified site: Secondary | ICD-10-CM | POA: Insufficient documentation

## 2020-12-13 DIAGNOSIS — B349 Viral infection, unspecified: Secondary | ICD-10-CM | POA: Insufficient documentation

## 2020-12-13 DIAGNOSIS — U071 COVID-19: Secondary | ICD-10-CM | POA: Diagnosis not present

## 2020-12-13 DIAGNOSIS — R519 Headache, unspecified: Secondary | ICD-10-CM | POA: Diagnosis not present

## 2020-12-13 DIAGNOSIS — R197 Diarrhea, unspecified: Secondary | ICD-10-CM | POA: Diagnosis not present

## 2020-12-13 LAB — RESP PANEL BY RT-PCR (FLU A&B, COVID) ARPGX2
Influenza A by PCR: NEGATIVE
Influenza B by PCR: NEGATIVE
SARS Coronavirus 2 by RT PCR: POSITIVE — AB

## 2020-12-13 NOTE — Discharge Instructions (Addendum)
Your COVID and Flu tests are pending.  You should self quarantine until the test results are back.    Take Tylenol or ibuprofen as needed for fever or discomfort.  Rest and keep yourself hydrated.    Follow-up with your primary care provider if your symptoms are not improving.     

## 2020-12-13 NOTE — ED Provider Notes (Signed)
[facility]    CSN: [id number] Arrival date & time: 12/13/20  1540      History   Chief Complaint Chief Complaint  Patient presents with  . Headache  . Cough    HPI Ryan Vargas is a 44 y.o. male.   Patient presents with 3-day history of headache, fatigue, cough, diarrhea.  3 episodes of diarrhea today.  He also reports possible low-grade fever but has not taken his temperature.  He denies rash, sore throat, shortness of breath, vomiting, or other symptoms.  OTC treatment attempted.  His medical history includes arthritis and blind left eye.  The history is provided by the patient and medical records.    Past Medical History:  Diagnosis Date  . Arthritis 03/2012   right subtalar navicular arthritis    Patient Active Problem List   Diagnosis Date Noted  . Elevated liver enzymes 02/06/2019  . IFG (impaired fasting glucose) 02/06/2019  . Hypertriglyceridemia, familial 06/08/2017  . Blind left eye 06/07/2017    Past Surgical History:  Procedure Laterality Date  . ANKLE FUSION  04/20/2012   Procedure: ARTHRODESIS ANKLE;  Surgeon: Toni Arthurs, MD;  Location: [facility];  Service: Orthopedics;  Laterality: Right;  RIGHT GASTROC RECESSION, SUBTALAR ARTHRODESIS, TALAR NAVICULAR ARTHRODESIS       Home Medications    Prior to Admission medications   Medication Sig Start Date End Date Taking? Authorizing Provider  omega-3 acid ethyl esters (LOVAZA) 1 g capsule Take 2 capsules (2 g total) by mouth 2 (two) times daily. 08/11/18   Agapito Games, MD    Family History Family History  Problem Relation Age of Onset  . Diabetes Father   . Hypertension Father     Social History Social History   Tobacco Use  . Smoking status: Never Smoker  . Smokeless tobacco: Never Used  Substance Use Topics  . Alcohol use: No    Comment: quit drinking 12/2011  . Drug use: No     Allergies   Pollen extract   Review of Systems Review of Systems   Constitutional: Positive for fatigue and fever. Negative for chills.  HENT: Negative for ear pain and sore throat.   Eyes: Negative for pain and visual disturbance.  Respiratory: Positive for cough. Negative for shortness of breath.   Cardiovascular: Negative for chest pain and palpitations.  Gastrointestinal: Positive for diarrhea. Negative for abdominal pain and vomiting.  Genitourinary: Negative for dysuria and hematuria.  Musculoskeletal: Negative for arthralgias and back pain.  Skin: Negative for color change and rash.  Neurological: Positive for headaches. Negative for seizures and syncope.  All other systems reviewed and are negative.    Physical Exam Triage Vital Signs ED Triage Vitals  Enc Vitals Group     BP      Pulse      Resp      Temp      Temp src      SpO2      Weight      Height      Head Circumference      Peak Flow      Pain Score      Pain Loc      Pain Edu?      Excl. in GC?    No data found.  Updated Vital Signs BP (!) 141/86 (BP Location: Left Arm)   Pulse 93   Temp 100.1 F (37.8 C) (Oral)   Resp 20   SpO2  99%   Visual Acuity Right Eye Distance:   Left Eye Distance:   Bilateral Distance:    Right Eye Near:   Left Eye Near:    Bilateral Near:     Physical Exam Vitals and nursing note reviewed.  Constitutional:      General: He is not in acute distress.    Appearance: He is well-developed and well-nourished.  HENT:     Head: Normocephalic and atraumatic.     Right Ear: Tympanic membrane normal.     Left Ear: Tympanic membrane normal.     Nose: Nose normal.     Mouth/Throat:     Mouth: Mucous membranes are moist.     Pharynx: Oropharynx is clear.  Eyes:     Conjunctiva/sclera: Conjunctivae normal.  Cardiovascular:     Rate and Rhythm: Normal rate and regular rhythm.     Heart sounds: Normal heart sounds.  Pulmonary:     Effort: Pulmonary effort is normal. No respiratory distress.     Breath sounds: Normal breath sounds.   Abdominal:     Palpations: Abdomen is soft.     Tenderness: There is no abdominal tenderness. There is no guarding or rebound.  Musculoskeletal:        General: No edema.     Cervical back: Neck supple.  Skin:    General: Skin is warm and dry.     Findings: No rash.  Neurological:     General: No focal deficit present.     Mental Status: He is alert and oriented to person, place, and time.     Gait: Gait normal.  Psychiatric:        Mood and Affect: Mood and affect and mood normal.        Behavior: Behavior normal.      UC Treatments / Results  Labs (all labs ordered are listed, but only abnormal results are displayed) Labs Reviewed  RESP PANEL BY RT-PCR (FLU A&B, COVID) ARPGX2    EKG   Radiology No results found.  Procedures Procedures (including critical care time)  Medications Ordered in UC Medications - No data to display  Initial Impression / Assessment and Plan / UC Course  I have reviewed the triage vital signs and the nursing notes.  Pertinent labs & imaging results that were available during my care of the patient were reviewed by me and considered in my medical decision making (see chart for details).   Viral illness.  Influenza and COVID pending.  Instructed patient to self quarantine until the test results are back.  Discussed symptomatic treatment including Tylenol, rest, hydration.  Instructed patient to follow up with PCP if him symptoms are not improving  Patient agrees to plan of care.    Final Clinical Impressions(s) / UC Diagnoses   Final diagnoses:  Viral illness     Discharge Instructions     Your COVID and Flu tests are pending.  You should self quarantine until the test results are back.    Take Tylenol or ibuprofen as needed for fever or discomfort.  Rest and keep yourself hydrated.    Follow-up with your primary care provider if your symptoms are not improving.        ED Prescriptions    None     PDMP not reviewed this  encounter.   Mickie Bail, NP 12/13/20 1722

## 2020-12-13 NOTE — ED Triage Notes (Signed)
Pt in with c/o productive cough and headache that has been going on for approx 3 days. Also c/o subjective fever  Pt states he took migraine meds with some relief but cannot remember the name  Also c/o lower back pain and diarrhea  Denies vomiting

## 2020-12-14 ENCOUNTER — Telehealth (HOSPITAL_COMMUNITY): Payer: Self-pay

## 2020-12-14 ENCOUNTER — Telehealth: Payer: Self-pay | Admitting: Unknown Physician Specialty

## 2020-12-14 NOTE — Telephone Encounter (Signed)
Called to Discuss with patient about Covid symptoms and the use of the monoclonal antibody infusion for those with mild to moderate Covid symptoms and at a high risk of hospitalization.     Pt appears to qualify for this infusion due to co-morbid conditions and/or a member of an at-risk group in accordance with the FDA Emergency Use Authorization.    Unable to reach pt   LMOM 

## 2020-12-16 ENCOUNTER — Telehealth: Payer: Self-pay | Admitting: Oncology

## 2020-12-16 ENCOUNTER — Encounter: Payer: Self-pay | Admitting: Oncology

## 2020-12-16 NOTE — Telephone Encounter (Signed)
Re: [facility] Infusion  Called to Discuss with patient about Covid symptoms and the use of regeneron, a monoclonal antibody infusion for those with mild to moderate Covid symptoms and at a high risk of hospitalization.     Pt is qualified for this infusion at the [facility] center due to co-morbid conditions and/or a member of an at-risk group.    Past Medical History:  Diagnosis Date  . Arthritis 03/2012   right subtalar navicular arthritis    Specific risk condition-HLD and elevated LFTS  Spoke to patient Ryan Vargas's who would like for me to speak to his wife Ryan Vargas's regarding any additional questions she may have about the infusion.  Called and left a voicemail and waiting for a return call back.    Mignon Pine, AGNP-C [phone] (Infusion Center Hotline)

## 2020-12-29 DIAGNOSIS — Z23 Encounter for immunization: Secondary | ICD-10-CM | POA: Diagnosis not present

## 2021-03-23 ENCOUNTER — Ambulatory Visit (INDEPENDENT_AMBULATORY_CARE_PROVIDER_SITE_OTHER): Payer: BC Managed Care – PPO | Admitting: Sports Medicine

## 2021-03-23 ENCOUNTER — Ambulatory Visit (INDEPENDENT_AMBULATORY_CARE_PROVIDER_SITE_OTHER): Payer: BC Managed Care – PPO

## 2021-03-23 ENCOUNTER — Other Ambulatory Visit: Payer: Self-pay

## 2021-03-23 DIAGNOSIS — S76312A Strain of muscle, fascia and tendon of the posterior muscle group at thigh level, left thigh, initial encounter: Secondary | ICD-10-CM | POA: Diagnosis not present

## 2021-03-23 DIAGNOSIS — S79922A Unspecified injury of left thigh, initial encounter: Secondary | ICD-10-CM | POA: Diagnosis not present

## 2021-03-23 MED ORDER — TRAMADOL HCL 50 MG PO TABS
50.0000 mg | ORAL_TABLET | Freq: Three times a day (TID) | ORAL | 0 refills | Status: DC | PRN
Start: 2021-03-23 — End: 2022-08-25

## 2021-03-23 MED ORDER — MELOXICAM 15 MG PO TABS: ORAL_TABLET | ORAL | 3 refills | Status: DC

## 2021-03-23 NOTE — Progress Notes (Signed)
    Procedures performed today:    The thigh was strapped with a compressive dressing.  Independent interpretation of notes and tests performed by another provider:   None.  Brief History, Exam, Impression, and Recommendations:    Left hamstring muscle strain This is a pleasant 45 year old male, strained his left hamstring playing basketball. Pain, swelling, bruising over the biceps femoris, no palpable defects in the muscle belly, no tenderness at the knee or at the ischial tuberosity. I have strapped his thigh with a compressive dressing, adding meloxicam, tramadol for breakthrough pain. He will need to do some physical therapy starting and may be a couple of weeks. Return to see me in 2 weeks.    ___________________________________________ [name]. Benjamin Stain, M.D., ABFM., CAQSM. Primary Care and Sports Medicine [facility]  Adjunct Instructor of Family Medicine  [employer]

## 2021-03-23 NOTE — Assessment & Plan Note (Signed)
This is a pleasant 45 year old male, strained his left hamstring playing basketball. Pain, swelling, bruising over the biceps femoris, no palpable defects in the muscle belly, no tenderness at the knee or at the ischial tuberosity. I have strapped his thigh with a compressive dressing, adding meloxicam, tramadol for breakthrough pain. He will need to do some physical therapy starting and may be a couple of weeks. Return to see me in 2 weeks.

## 2021-04-06 ENCOUNTER — Ambulatory Visit: Payer: BC Managed Care – PPO | Admitting: Sports Medicine

## 2021-04-08 ENCOUNTER — Ambulatory Visit (INDEPENDENT_AMBULATORY_CARE_PROVIDER_SITE_OTHER): Payer: BC Managed Care – PPO | Admitting: Sports Medicine

## 2021-04-08 ENCOUNTER — Other Ambulatory Visit: Payer: Self-pay

## 2021-04-08 DIAGNOSIS — S76312D Strain of muscle, fascia and tendon of the posterior muscle group at thigh level, left thigh, subsequent encounter: Secondary | ICD-10-CM

## 2021-04-08 NOTE — Patient Instructions (Signed)
Advanced Hamstring Rehab Protocol Plyometrics: bound and land in running position; 3 sets of 15 level first; then up a rise; then down a rise; only advance when level is easy. No greater than 45 deg knee flexion. Stride drills- 10 x 75 yards; bounding but no more than 45 deg knee flexion. Level for 1 week; then add up hill for 1 week; then add down hill. Hop drills- 15 repetitive hops- 3 sets; compare injured to non injured leg Nordic hamstring exercises- stabilize body kneeling;1 set of 5-10 and progress slowly(Don't attempt if painful) 

## 2021-04-08 NOTE — Progress Notes (Signed)
    Procedures performed today:    None.  Independent interpretation of notes and tests performed by another provider:   None.  Brief History, Exam, Impression, and Recommendations:    Left hamstring muscle strain This pleasant 45 year old male sprained his left hamstring playing basketball about 2 weeks ago, he is doing really well today, no tenderness over the muscle belly, no palpable defect, good strength, good motion. He is healed, we are going to get him set up with advanced hamstring conditioning to prevent this in the future.    ___________________________________________ [name]. Benjamin Stain, M.D., ABFM., CAQSM. Primary Care and Sports Medicine [facility]  Adjunct Instructor of Family Medicine  [employer]

## 2021-04-08 NOTE — Assessment & Plan Note (Signed)
This pleasant 45 year old male sprained his left hamstring playing basketball about 2 weeks ago, he is doing really well today, no tenderness over the muscle belly, no palpable defect, good strength, good motion. He is healed, we are going to get him set up with advanced hamstring conditioning to prevent this in the future.

## 2022-03-22 ENCOUNTER — Encounter (HOSPITAL_COMMUNITY): Payer: Self-pay | Admitting: Emergency Medicine

## 2022-03-22 ENCOUNTER — Ambulatory Visit (HOSPITAL_COMMUNITY)
Admission: EM | Admit: 2022-03-22 | Discharge: 2022-03-22 | Disposition: A | Discharge status: Home / Self Care | Payer: BC Managed Care – PPO | Attending: Sports Medicine | Admitting: Sports Medicine

## 2022-03-22 DIAGNOSIS — S61213A Laceration without foreign body of left middle finger without damage to nail, initial encounter: Secondary | ICD-10-CM | POA: Diagnosis not present

## 2022-03-22 MED ORDER — AMOXICILLIN-POT CLAVULANATE 875-125 MG PO TABS: 1.0000 | ORAL_TABLET | Freq: Two times a day (BID) | ORAL | 0 refills | Status: AC

## 2022-03-22 NOTE — ED Provider Notes (Signed)
?[place] ? ? ? ?CSN: [id number] ?Arrival date & time: 03/22/22  [id number] ? ? ?  ? ?History   ?Chief Complaint ?Chief Complaint  ?Patient presents with  ? Finger Injury  ? ? ?HPI ?Ryan Vargas is a 46 y.o. male here for left middle finger injury ? ?HPI ? ?Had left middle finger injury on Friday morning ?Was cut with plexiglass, slit over the side of his cuticle - did not get his nail. Achieved hemostasis with pressure. 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 ?Past Medical History:  ?Diagnosis Date  ? Arthritis 03/2012  ? right subtalar navicular arthritis  ? ? ?Patient Active Problem List  ? Diagnosis Date Noted  ? Left hamstring muscle strain 03/23/2021  ? Elevated liver enzymes 02/06/2019  ? IFG (impaired fasting glucose) 02/06/2019  ? Hypertriglyceridemia, familial 06/08/2017  ? Blind left eye 06/07/2017  ? ? ?Past Surgical History:  ?Procedure Laterality Date  ? ANKLE FUSION  04/20/2012  ? Procedure: ARTHRODESIS ANKLE;  Surgeon: Wylene Simmer, MD;  Location: [facility];  Service: Orthopedics;  Laterality: Right;  RIGHT GASTROC RECESSION, SUBTALAR ARTHRODESIS, TALAR NAVICULAR ARTHRODESIS  ? ? ? ? ? ?Home Medications   ? ?Prior to Admission medications   ?Medication Sig Start Date End Date Taking? Authorizing Provider  ?amoxicillin-clavulanate (AUGMENTIN) 875-125 MG tablet Take 1 tablet by mouth every 12 (twelve) hours for 6 days. 03/22/22 03/28/22 Yes Elba Barman, DO  ?meloxicam (MOBIC) 15 MG tablet One tab PO qAM with a meal for 2 weeks, then daily prn pain. 03/23/21   Silverio Decamp, MD  ?omega-3 acid ethyl  esters (LOVAZA) 1 g capsule Take 2 capsules (2 g total) by mouth 2 (two) times daily. 08/11/18   Hali Marry, MD  ?traMADol (ULTRAM) 50 MG tablet Take 1-2 tablets (50-100 mg total) by mouth every 8 (eight) hours as needed for moderate pain. Maximum 6 tabs per day. 03/23/21   Silverio Decamp, MD  ? ? ?Family History ?Family History  ?Problem Relation Age of Onset  ? Diabetes Father   ? Hypertension Father   ? ? ?Social History ?Social History  ? ?Tobacco Use  ? Smoking status: Never  ? Smokeless tobacco: Never  ?Substance Use Topics  ? Alcohol use: No  ?  Comment: quit drinking 12/2011  ? Drug use: No  ? ? ? ?Allergies   ?Pollen extract ? ? ?Review of Systems ?Review of Systems  ?Constitutional:  Negative for activity change, chills and fever.  ?Musculoskeletal:  Positive for arthralgias (left distal middle finger pain).  ?Skin:  Positive for wound. Negative for rash.  ? ? ?Physical Exam ?Triage Vital Signs ?ED Triage Vitals [03/22/22 0818]  ?Enc Vitals Group  ?   BP   ?   Pulse   ?   Resp   ?   Temp   ?   Temp src   ?   SpO2   ?   Weight   ?   Height   ?   Head Circumference   ?   Peak Flow   ?  Pain Score 6  ?   Pain Loc   ?   Pain Edu?   ?   Excl. in [place]?   ? ?No data found. ? ?Updated Vital Signs ?There were no vitals taken for this visit. ? ?Physical Exam ?Constitutional:   ?   General: He is not in acute distress. ?   Appearance: Normal appearance. He is not ill-appearing or toxic-appearing.  ?HENT:  ?   Head: Normocephalic and atraumatic.  ?Cardiovascular:  ?   Rate and Rhythm: Normal rate.  ?Pulmonary:  ?   Effort: Pulmonary effort is normal.  ?Musculoskeletal:     ?   General: Signs of injury (left middle finger (lateral cuticle); no injury to nail) present. No swelling. Normal range of motion.  ?Skin: ?   General: Skin is warm.  ?   Capillary Refill: Capillary refill takes less than 2 seconds.  ?   Findings: No erythema or rash.  ?Neurological:  ?   Mental Status: He is alert.  ?Psychiatric:      ?   Mood and Affect: Mood normal.  ? ? ? ?UC Treatments / Results  ?Labs ?(all labs ordered are listed, but only abnormal results are displayed) ?Labs Reviewed - No data to display ? ?EKG ? ? ?Radiology ?No results found. ? ?Procedures ?Procedures (including critical care time) ? ?Medications Ordered in UC ?Medications - No data to display ? ?Initial Impression / Assessment and Plan / UC Course  ?I have reviewed the triage vital signs and the nursing notes. ? ?Pertinent labs & imaging results that were available during my care of the patient were reviewed by me and considered in my medical decision making (see chart for details). ? ?  ? ?Patient presents with superficial laceration to the lateral aspect of the cuticle of the left middle finger.  This happened on Friday, which was 3 days ago.  He achieved good hemostasis at that time.  He bumped it this morning and it aggravated the pain.  There is no damage to the nailbed itself.  Probing through the wound I did not see any evidence of foreign body.  He has full range of motion at the DIP, PIP and MCP of the finger.  There is no overlying erythema.  Patient reports that he is up-to-date on his tetanus vaccination with his PCP.  We will cover him with Augmentin twice daily for the next 6 days to prevent any form of infection.  I discussed with him cessation of alcohol application and hydrogen peroxide application to attempt to let the skin heal.  We did use a foam finger splint to splint the DIP for protection when he is working.  He may take this off when not working at home and resting.  May use topical antibiotic ointment and bandage for coverage.  Strict return precautions provided regarding further injury or signs or symptoms of infection.  Otherwise he may follow-up with his PCP. ?Final Clinical Impressions(s) / UC Diagnoses  ? ?Final diagnoses:  ?Laceration of left middle finger without damage to nail, foreign body presence unspecified, initial encounter   ? ? ? ?Discharge Instructions   ? ?  ?- Keep the finger splint on when you are working to avoid aggravation, make sure you take this off at home or at nighttime to let the area breathe ?-You will take Augmentin, 1 pill twice a day for the next 6 days to prevent infection ?-You may use topical antibiotic ointment over this area and  cover with a Band-Aid ? ?-The skin may fall off, but if any redness or increased pain presents in the next couple days, recommend following up here or with your PCP ? ? ? ? ?ED Prescriptions   ? ? Medication Sig Dispense Auth. Provider  ? amoxicillin-clavulanate (AUGMENTIN) 875-125 MG tablet Take 1 tablet by mouth every 12 (twelve) hours for 6 days. 12 tablet Elba Barman, DO  ? ?  ? ?PDMP not reviewed this encounter. ?  Elba Barman, DO ?03/22/22 [id number] ? ?

## 2022-03-22 NOTE — ED Triage Notes (Signed)
Pt cut left middle finger Friday when plexi-glass broke. Reports cut skin at cuticle area and skin laid open. Pt has cleaned it everyday and kept it covered.  ?

## 2022-03-22 NOTE — Discharge Instructions (Signed)
-   Keep the finger splint on when you are working to avoid aggravation, make sure you take this off at home or at nighttime to let the area breathe ?-You will take Augmentin, 1 pill twice a day for the next 6 days to prevent infection ?-You may use topical antibiotic ointment over this area and cover with a Band-Aid ? ?-The skin may fall off, but if any redness or increased pain presents in the next couple days, recommend following up here or with your PCP ?

## 2022-08-25 ENCOUNTER — Ambulatory Visit (INDEPENDENT_AMBULATORY_CARE_PROVIDER_SITE_OTHER): Payer: BC Managed Care – PPO | Admitting: Family Medicine

## 2022-08-25 ENCOUNTER — Other Ambulatory Visit: Payer: Self-pay | Admitting: Family Medicine

## 2022-08-25 ENCOUNTER — Encounter: Payer: Self-pay | Admitting: Family Medicine

## 2022-08-25 VITALS — BP 137/76 | HR 63 | Resp 20 | Ht 74.0 in | Wt 252.0 lb

## 2022-08-25 DIAGNOSIS — Z0000 Encounter for general adult medical examination without abnormal findings: Secondary | ICD-10-CM | POA: Diagnosis not present

## 2022-08-25 DIAGNOSIS — R7301 Impaired fasting glucose: Secondary | ICD-10-CM

## 2022-08-25 DIAGNOSIS — E781 Pure hyperglyceridemia: Secondary | ICD-10-CM

## 2022-08-25 MED ORDER — OMEGA-3-ACID ETHYL ESTERS 1 G PO CAPS: 2.0000 g | ORAL_CAPSULE | Freq: Two times a day (BID) | ORAL | 3 refills | Status: DC

## 2022-08-25 NOTE — Progress Notes (Signed)
Complete physical exam  Patient: Ryan Vargas   DOB: 1976/09/01   46 y.o. Male  MRN: [id number]  Subjective:    Chief Complaint  Patient presents with   Annual Exam    Ryan Vargas is a 46 y.o. male who presents today for a complete physical exam. He reports consuming a general diet. Gym/ health club routine includes working out 2 days week.Marland Kitchen He generally feels well. He reports sleeping well. He does have additional problems to discuss today.    Most recent fall risk assessment:     No data to display           Most recent depression screenings:    08/25/2022    8:53 AM 08/10/2018    8:58 AM  PHQ 2/9 Scores  PHQ - 2 Score 0 0        Patient Care Team: Agapito Games, MD as PCP - General (Family Medicine)   Outpatient Medications Prior to Visit  Medication Sig   meloxicam (MOBIC) 15 MG tablet One tab PO qAM with a meal for 2 weeks, then daily prn pain.   [DISCONTINUED] omega-3 acid ethyl esters (LOVAZA) 1 g capsule Take 2 capsules (2 g total) by mouth 2 (two) times daily.   [DISCONTINUED] traMADol (ULTRAM) 50 MG tablet Take 1-2 tablets (50-100 mg total) by mouth every 8 (eight) hours as needed for moderate pain. Maximum 6 tabs per day. (Patient not taking: Reported on 08/25/2022)   No facility-administered medications prior to visit.    ROS        Objective:     BP 137/76 (BP Location: Left Arm, Cuff Size: Large)   Pulse 63   Resp 20   Ht 6\' 2"  (1.88 m)   Wt 252 lb 0.6 oz (114.3 kg)   SpO2 100%   BMI 32.36 kg/m    Physical Exam   No results found for any visits on 08/25/22.     Assessment & Plan:    Routine Health Maintenance and Physical Exam   There is no immunization history on file for this patient.  Health Maintenance  Topic Date Due   HIV Screening  Never done   Hepatitis C Screening  Never done   COLONOSCOPY (Pts 45-80yrs Insurance coverage will need to be confirmed)  Never done   COVID-19 Vaccine (1) 09/10/2022  (Originally 12/04/1976)   INFLUENZA VACCINE  03/27/2023 (Originally 07/27/2022)   TETANUS/TDAP  08/26/2023 (Originally 12/27/2020)   Pneumococcal Vaccine 31-21 Years old  Aged Out   HPV VACCINES  Aged Out    Discussed health benefits of physical activity, and encouraged him to engage in regular exercise appropriate for his age and condition.  Problem List Items Addressed This Visit       Endocrine   IFG (impaired fasting glucose)   Relevant Orders   Hemoglobin A1c     Other   Hypertriglyceridemia, familial   Relevant Medications   omega-3 acid ethyl esters (LOVAZA) 1 g capsule   Other Visit Diagnoses     Wellness examination    -  Primary   Relevant Orders   Lipid Panel w/reflex Direct LDL   COMPLETE METABOLIC PANEL WITH GFR   CBC   Hepatitis C Antibody   Ambulatory referral to Gastroenterology      Keep up a regular exercise program and make sure you are eating a healthy diet Try to eat 4 servings of dairy a day, or if you are lactose intolerant take a  calcium with vitamin D daily.  Your vaccines are up to date.  Discussed need for colon cancer screening.   No follow-ups on file.     Nani Gasser, MD

## 2022-08-26 LAB — COMPLETE METABOLIC PANEL WITH GFR
AG Ratio: 1.7 (calc) (ref 1.0–2.5)
ALT: 20 U/L (ref 9–46)
AST: 18 U/L (ref 10–40)
Albumin: 4.5 g/dL (ref 3.6–5.1)
Alkaline phosphatase (APISO): 81 U/L (ref 36–130)
BUN: 11 mg/dL (ref 7–25)
CO2: 28 mmol/L (ref 20–32)
Calcium: 9.9 mg/dL (ref 8.6–10.3)
Chloride: 105 mmol/L (ref 98–110)
Creat: 1.15 mg/dL (ref 0.60–1.29)
Globulin: 2.7 g/dL (calc) (ref 1.9–3.7)
Glucose, Bld: 93 mg/dL (ref 65–139)
Potassium: 4.3 mmol/L (ref 3.5–5.3)
Sodium: 140 mmol/L (ref 135–146)
Total Bilirubin: 0.4 mg/dL (ref 0.2–1.2)
Total Protein: 7.2 g/dL (ref 6.1–8.1)
eGFR: 79 mL/min/{1.73_m2} (ref >=60)

## 2022-08-26 LAB — CBC
HCT: 42.5 % (ref 38.5–50.0)
Hemoglobin: 14.7 g/dL (ref 13.2–17.1)
MCH: 31.5 pg (ref 27.0–33.0)
MCHC: 34.6 g/dL (ref 32.0–36.0)
MCV: 91 fL (ref 80.0–100.0)
MPV: 11 fL (ref 7.5–12.5)
Platelets: 311 10*3/uL (ref 140–400)
RBC: 4.67 10*6/uL (ref 4.20–5.80)
RDW: 13.2 % (ref 11.0–15.0)
WBC: 6.1 10*3/uL (ref 3.8–10.8)

## 2022-08-26 LAB — HEMOGLOBIN A1C
Hgb A1c MFr Bld: 5.6 % of total Hgb (ref <5.7)
Mean Plasma Glucose: 114 mg/dL
eAG (mmol/L): 6.3 mmol/L

## 2022-08-26 LAB — LIPID PANEL W/REFLEX DIRECT LDL
Cholesterol: 185 mg/dL (ref <200)
HDL: 38 mg/dL — ABNORMAL LOW (ref >=40)
LDL Cholesterol (Calc): 97 mg/dL (calc)
Non-HDL Cholesterol (Calc): 147 mg/dL (calc) — ABNORMAL HIGH (ref <130)
Total CHOL/HDL Ratio: 4.9 (calc) (ref <5.0)
Triglycerides: 373 mg/dL — ABNORMAL HIGH (ref <150)

## 2022-08-26 LAB — HEPATITIS C ANTIBODY: Hepatitis C Ab: NONREACTIVE

## 2022-08-26 NOTE — Telephone Encounter (Signed)
Alternative  

## 2022-08-26 NOTE — Progress Notes (Signed)
Hi Ryan Vargas, your triglycerides are high at 373.  Normal is less than 150.  Does look a little better than last year which is great it was 505 years ago and now it is down to 373.  Continue with the prescription fish oil it does bruit help reduce your risk for pancreatitis etc.  Your A1c also looks better this time.  It was in the prediabetes range previously.  It is back into the normal range so just continue to work on healthy food choices and cutting back on sweets and carbs.  Other labs look great.

## 2022-08-27 NOTE — Telephone Encounter (Signed)
Lovaza expensive.  We will switch to Vascepa.  If it needs a PA let me know.

## 2022-09-10 ENCOUNTER — Telehealth: Payer: Self-pay

## 2022-09-10 NOTE — Telephone Encounter (Signed)
PA started for vascepa sent to plan awaiting approval

## 2022-11-11 ENCOUNTER — Telehealth: Payer: Self-pay

## 2022-11-11 NOTE — Telephone Encounter (Signed)
Resubmission Prior authorization [id number]:[id number] 1GM capsules Via: Covermymeds Case/Key:[id number] Status: approved  as of 11/11/22 Reason: Notified Pt via: Mychart

## 2023-04-19 ENCOUNTER — Other Ambulatory Visit: Payer: Self-pay | Admitting: *Deleted

## 2023-04-19 DIAGNOSIS — Z1211 Encounter for screening for malignant neoplasm of colon: Secondary | ICD-10-CM

## 2023-08-05 DIAGNOSIS — D125 Benign neoplasm of sigmoid colon: Secondary | ICD-10-CM | POA: Diagnosis not present

## 2023-08-05 DIAGNOSIS — K648 Other hemorrhoids: Secondary | ICD-10-CM | POA: Diagnosis not present

## 2023-08-05 DIAGNOSIS — Z1211 Encounter for screening for malignant neoplasm of colon: Secondary | ICD-10-CM | POA: Diagnosis not present

## 2023-08-05 DIAGNOSIS — K635 Polyp of colon: Secondary | ICD-10-CM | POA: Diagnosis not present

## 2023-08-05 LAB — HM COLONOSCOPY

## 2023-09-01 ENCOUNTER — Telehealth: Payer: Self-pay

## 2023-09-01 ENCOUNTER — Encounter: Payer: Self-pay | Admitting: Family Medicine

## 2023-09-01 ENCOUNTER — Ambulatory Visit (INDEPENDENT_AMBULATORY_CARE_PROVIDER_SITE_OTHER): Payer: BC Managed Care – PPO

## 2023-09-01 ENCOUNTER — Ambulatory Visit (INDEPENDENT_AMBULATORY_CARE_PROVIDER_SITE_OTHER): Payer: BC Managed Care – PPO | Admitting: Family Medicine

## 2023-09-01 VITALS — BP 121/65 | HR 70 | Temp 98.6°F | Ht 74.0 in | Wt 250.0 lb

## 2023-09-01 DIAGNOSIS — K760 Fatty (change of) liver, not elsewhere classified: Secondary | ICD-10-CM | POA: Diagnosis not present

## 2023-09-01 DIAGNOSIS — R110 Nausea: Secondary | ICD-10-CM

## 2023-09-01 DIAGNOSIS — K5732 Diverticulitis of large intestine without perforation or abscess without bleeding: Secondary | ICD-10-CM | POA: Diagnosis not present

## 2023-09-01 DIAGNOSIS — R1030 Lower abdominal pain, unspecified: Secondary | ICD-10-CM

## 2023-09-01 LAB — POCT URINALYSIS DIP (CLINITEK)
Bilirubin, UA: NEGATIVE
Glucose, UA: NEGATIVE mg/dL
Leukocytes, UA: NEGATIVE
Nitrite, UA: NEGATIVE
POC PROTEIN,UA: NEGATIVE
Spec Grav, UA: 1.025
Urobilinogen, UA: 1 U/dL
pH, UA: 5.5

## 2023-09-01 MED ORDER — IOHEXOL 9 MG/ML PO SOLN: 500.0000 mL | ORAL | Status: AC

## 2023-09-01 MED ORDER — METRONIDAZOLE 500 MG PO TABS: 500.0000 mg | ORAL_TABLET | Freq: Two times a day (BID) | ORAL | 0 refills | Status: DC

## 2023-09-01 MED ORDER — IOHEXOL 300 MG/ML  SOLN
100.0000 mL | Freq: Once | INTRAMUSCULAR | Status: AC | PRN
  Administered 2023-09-01: 100 mL via INTRAVENOUS

## 2023-09-01 MED ORDER — CIPROFLOXACIN HCL 500 MG PO TABS: 500.0000 mg | ORAL_TABLET | Freq: Two times a day (BID) | ORAL | 0 refills | Status: DC

## 2023-09-01 NOTE — Progress Notes (Signed)
Acute Office Visit  Subjective:     Patient ID: Ryan Vargas, male    DOB: December 06, 1976, 47 y.o.   MRN: [id number]  Chief Complaint  Patient presents with   Abdominal Pain    HPI Patient is in today for lower abd pain that started about 1 week after his colonoscopy.  He underwent colonoscopy on August 9 with [facility] with Dr. Casimiro Needle new [place] Grater.  He did have a 1 cm pedunculated polyp removed, 2.5 cm pedunculated polyp in the sigmoid colon, 5 mm semipedunculated polyp in the sigmoid colon and another 1.5 cm polyp in the sigmoid:.  Also noted to have small a internal and external hemorrhoids.  ROS      Objective:    BP 121/65   Pulse 70   Temp 98.6 F (37 C)   Ht 6\' 2"  (1.88 m)   Wt 250 lb (113.4 kg)   SpO2 100%   BMI 32.10 kg/m    Physical Exam Vitals and nursing note reviewed.  Constitutional:      Appearance: Normal appearance.  HENT:     Head: Normocephalic and atraumatic.  Eyes:     Conjunctiva/sclera: Conjunctivae normal.  Cardiovascular:     Rate and Rhythm: Normal rate and regular rhythm.  Pulmonary:     Effort: Pulmonary effort is normal.     Breath sounds: Normal breath sounds.  Abdominal:     General: Bowel sounds are decreased.     Tenderness: There is abdominal tenderness in the right lower quadrant, suprapubic area and left lower quadrant. There is guarding and rebound.  Skin:    General: Skin is warm and dry.  Neurological:     Mental Status: He is alert.  Psychiatric:        Mood and Affect: Mood normal.     Results for orders placed or performed in visit on 09/01/23  POCT URINALYSIS DIP (CLINITEK)  Result Value Ref Range   Color, UA orange (A) yellow   Clarity, UA clear clear   Glucose, UA negative negative mg/dL   Bilirubin, UA negative negative   Ketones, POC UA small (15) (A) negative mg/dL   Spec Grav, UA 4.098 1.191 - 1.025   Blood, UA trace-lysed (A) negative   pH, UA 5.5 5.0 - 8.0   POC PROTEIN,UA negative negative, trace    Urobilinogen, UA 1.0 0.2 or 1.0 E.U./dL   Nitrite, UA Negative Negative   Leukocytes, UA Negative Negative        Assessment & Plan:   Problem List Items Addressed This Visit   None Visit Diagnoses     Lower abdominal pain    -  Primary   Relevant Orders   POCT URINALYSIS DIP (CLINITEK) (Completed)   CT ABDOMEN PELVIS W CONTRAST   CBC with Differential/Platelet   CMP14+EGFR   Nausea       Relevant Orders   CT ABDOMEN PELVIS W CONTRAST   CBC with Differential/Platelet   CMP14+EGFR     Lower abdominal pain with most tenderness focused around the suprapubic area.  He is in quite a lot of discomfort today.  Afebrile but having chills.  We did discuss CT with abdomen for further workup to evaluate for possible diverticulitis.  He also had 5 polyps removed about 4 weeks ago.  He has not seen any active bleeding in the stools.  Will get a stat CBC with differential and CMP as well.  Hepatitis and pancreatitis are less likely.  He does  still have an appendix.  Urinalysis was normal and reassuring so UTI is less likely.  No orders of the defined types were placed in this encounter.   No follow-ups on file.  Nani Gasser, MD

## 2023-09-01 NOTE — Telephone Encounter (Signed)
Stat call report - called by Vicente Serene / radiologly [phone] For CT abd/pelvis  Results : 1- acute  uncomlicated sigmoid diverticulitis  2- hepatic steatosis  Results in patient chart.

## 2023-09-01 NOTE — Progress Notes (Signed)
Hi Ryan Vargas, you have a condition called diverticulitis.  This is when you have a little pocket that is normally in the colon wall that is not normally a problem but it gets a little local infection in it.  I want you to work on staying well-hydrated and eating soft foods stay away from anything greasy spicy that way your digestive tract does not have to work as hard.  You may want to start taking some stool softeners as well with each meal especially since your bowel movements are not moving very well I am also going to call in an antibiotic for you to take to see if this helps how you are feeling.  If you are not improving over the weekend please let us know.  They also noted that your liver looks like it has increased fat content we can discuss that at next office visit.  The main treatment is healthy diet.

## 2023-09-02 ENCOUNTER — Encounter: Payer: Self-pay | Admitting: Family Medicine

## 2023-09-02 LAB — CBC WITH DIFFERENTIAL/PLATELET
Basophils Absolute: 0.1 10*3/uL (ref 0.0–0.2)
Basos: 0 %
EOS (ABSOLUTE): 0 10*3/uL (ref 0.0–0.4)
Eos: 0 %
Hematocrit: 44.2 % (ref 37.5–51.0)
Hemoglobin: 14.6 g/dL (ref 13.0–17.7)
Immature Grans (Abs): 0 10*3/uL (ref 0.0–0.1)
Immature Granulocytes: 0 %
Lymphocytes Absolute: 1 10*3/uL (ref 0.7–3.1)
Lymphs: 9 %
MCH: 29 pg (ref 26.6–33.0)
MCHC: 33 g/dL (ref 31.5–35.7)
MCV: 88 fL (ref 79–97)
Monocytes Absolute: 0.9 10*3/uL (ref 0.1–0.9)
Monocytes: 8 %
Neutrophils Absolute: 9.6 10*3/uL — ABNORMAL HIGH (ref 1.4–7.0)
Neutrophils: 83 %
Platelets: 322 10*3/uL (ref 150–450)
RBC: 5.03 x10E6/uL (ref 4.14–5.80)
RDW: 13.4 % (ref 11.6–15.4)
WBC: 11.7 10*3/uL — ABNORMAL HIGH (ref 3.4–10.8)

## 2023-09-02 LAB — CMP14+EGFR
ALT: 24 IU/L (ref 0–44)
AST: 16 IU/L (ref 0–40)
Albumin: 4.5 g/dL (ref 4.1–5.1)
Alkaline Phosphatase: 107 IU/L (ref 44–121)
BUN/Creatinine Ratio: 7 — ABNORMAL LOW (ref 9–20)
BUN: 8 mg/dL (ref 6–24)
Bilirubin Total: 1.3 mg/dL — ABNORMAL HIGH (ref 0.0–1.2)
CO2: 22 mmol/L (ref 20–29)
Calcium: 9.4 mg/dL (ref 8.7–10.2)
Chloride: 103 mmol/L (ref 96–106)
Creatinine, Ser: 1.11 mg/dL (ref 0.76–1.27)
Globulin, Total: 2.2 g/dL (ref 1.5–4.5)
Glucose: 120 mg/dL — ABNORMAL HIGH (ref 70–99)
Potassium: 4.2 mmol/L (ref 3.5–5.2)
Sodium: 139 mmol/L (ref 134–144)
Total Protein: 6.7 g/dL (ref 6.0–8.5)
eGFR: 82 mL/min/{1.73_m2} (ref >59)

## 2023-09-02 NOTE — Telephone Encounter (Signed)
Okay for work note.  Please make sure he also picked up his antibiotics yesterday and that he has started those.  Also want him to start taking a stool softener and drink plenty of water today.  Please see notes in regards to the scan and notes in regards to the blood work.

## 2023-09-02 NOTE — Telephone Encounter (Signed)
Letter has been written. I called patient and he did pick up the antibiotic. He also viewed the results.

## 2023-09-02 NOTE — Progress Notes (Signed)
HI Ryan Vargas, Maka blood cell count is elevated just slightly likely from the inflammation from the diverticulitis.  Hopefully you are able to pick up your antibiotic last night and go ahead and get started on that.  If at any point over the weekend you feel like your pain is getting worse please go to the emergency room.  Otherwise I encourage you to eat bland soft small amounts of food and stay really well-hydrated and take the antibiotics.

## 2023-09-15 ENCOUNTER — Encounter: Payer: Self-pay | Admitting: Family Medicine

## 2023-12-05 DIAGNOSIS — H0011 Chalazion right upper eyelid: Secondary | ICD-10-CM | POA: Diagnosis not present

## 2024-02-15 ENCOUNTER — Ambulatory Visit: Payer: BC Managed Care – PPO | Admitting: Sports Medicine

## 2024-02-24 ENCOUNTER — Ambulatory Visit (INDEPENDENT_AMBULATORY_CARE_PROVIDER_SITE_OTHER): Payer: BC Managed Care – PPO | Admitting: Sports Medicine

## 2024-02-24 ENCOUNTER — Encounter: Payer: Self-pay | Admitting: Sports Medicine

## 2024-02-24 ENCOUNTER — Ambulatory Visit: Payer: BC Managed Care – PPO

## 2024-02-24 ENCOUNTER — Other Ambulatory Visit: Payer: Self-pay | Admitting: Sports Medicine

## 2024-02-24 DIAGNOSIS — K625 Hemorrhage of anus and rectum: Secondary | ICD-10-CM

## 2024-02-24 DIAGNOSIS — M5416 Radiculopathy, lumbar region: Secondary | ICD-10-CM

## 2024-02-24 DIAGNOSIS — M549 Dorsalgia, unspecified: Secondary | ICD-10-CM | POA: Diagnosis not present

## 2024-02-24 DIAGNOSIS — M4726 Other spondylosis with radiculopathy, lumbar region: Secondary | ICD-10-CM | POA: Diagnosis not present

## 2024-02-24 MED ORDER — WITCH HAZEL-GLYCERIN EX PADS
1.0000 | MEDICATED_PAD | CUTANEOUS | 11 refills | Status: DC | PRN
Start: 2024-02-24 — End: 2024-04-30

## 2024-02-24 MED ORDER — HYDROCORTISONE ACETATE 25 MG RE SUPP
25.0000 mg | Freq: Two times a day (BID) | RECTAL | 11 refills | Status: DC | PRN
Start: 2024-02-24 — End: 2024-04-30

## 2024-02-24 MED ORDER — CYCLOBENZAPRINE HCL 10 MG PO TABS
ORAL_TABLET | ORAL | 0 refills | Status: DC
Start: 2024-02-24 — End: 2024-04-30

## 2024-02-24 MED ORDER — PREDNISONE 50 MG PO TABS
ORAL_TABLET | ORAL | 0 refills | Status: DC
Start: 2024-02-24 — End: 2024-04-06

## 2024-02-24 MED ORDER — DOCUSATE SODIUM 100 MG PO CAPS
100.0000 mg | ORAL_CAPSULE | Freq: Three times a day (TID) | ORAL | 3 refills | Status: DC | PRN
Start: 2024-02-24 — End: 2024-04-30

## 2024-02-24 NOTE — Assessment & Plan Note (Signed)
 Ryan Vargas has also been having some bright red blood per rectum, he does feel hemorrhoids when he wipes, moderately painful. We discussed the pathophysiology of what sound to be external hemorrhoids though I did not examine today. Adding Anusol HC suppositories, Colace, he will use witch hazel wipes. If not significantly better over the next couple of weeks he can touch base with his PCP or go directly to gastroenterology.

## 2024-02-24 NOTE — Progress Notes (Addendum)
    Procedures performed today:    None.  Independent interpretation of notes and tests performed by another provider:   I did personally review an abdominal and pelvic CT from approximately 5 months ago, he does have a large L5-S1 disc extrusion.  Brief History, Exam, Impression, and Recommendations:    Right lumbar radiculitis Very pleasant 48 year old male, increasing axial low back pain for several weeks, discogenic, radiation down the back of the right leg to the bottom of the right foot to the middle 3 toes all consistent with a right L5 distribution radiculitis. Exam is normal with the exception of some weakness to dorsiflexion right ankle but he is status post lower extremity surgery and the weakness is baseline for him. No signs or symptoms of cauda equina syndrome. We discussed the evolutionary anthropology of lumbar disc disease. We will add 5 days of prednisone, Flexeril at night, x-rays, formal PT [facility]. Return to see me in 6 weeks, MR for epidural planning if not better.  [name] has also been having some bright red blood per rectum, he does feel hemorrhoids when he wipes, moderately painful. We discussed the pathophysiology of what sound to be external hemorrhoids though I did not examine today. Adding Anusol HC suppositories, Colace, he will use witch hazel wipes. If not significantly better over the next couple of weeks he can touch base with his PCP or go directly to gastroenterology.    ____________________________________________ Ryan Vargas. Benjamin Stain, M.D., ABFM., CAQSM., AME. Primary Care and Sports Medicine [facility]  Adjunct Professor of Family Medicine  [employer]  Restaurant manager, fast food

## 2024-02-24 NOTE — Assessment & Plan Note (Addendum)
 Very pleasant 48 year old male, increasing axial low back pain for several weeks, discogenic, radiation down the back of the right leg to the bottom of the right foot to the middle 3 toes all consistent with a right L5 distribution radiculitis. Exam is normal with the exception of some weakness to dorsiflexion right ankle but he is status post lower extremity surgery and the weakness is baseline for him. No signs or symptoms of cauda equina syndrome. We discussed the evolutionary anthropology of lumbar disc disease. We will add 5 days of prednisone, Flexeril at night, x-rays, formal PT at [facility]. Return to see me in 6 weeks, MR for epidural planning if not better.

## 2024-02-28 NOTE — Therapy (Signed)
 OUTPATIENT PHYSICAL THERAPY THORACOLUMBAR EVALUATION   Patient Name: Ryan Vargas MRN: [id number] DOB:Feb 07, 1976, 48 y.o., male Today's Date: 02/29/2024  END OF SESSION:  PT End of Session - 02/29/24 0749     Visit Number 1    Number of Visits 13    Date for PT Re-Evaluation 04/11/24    Authorization Type [employer]    Authorization Time Period auth tbd    PT Start Time 0755    PT Stop Time 0838    PT Time Calculation (min) 43 min    Activity Tolerance Patient tolerated treatment well             Past Medical History:  Diagnosis Date   Arthritis 03/2012   right subtalar navicular arthritis   Past Surgical History:  Procedure Laterality Date   ANKLE FUSION  04/20/2012   Procedure: ARTHRODESIS ANKLE;  Surgeon: Toni Arthurs, MD;  Location:  [facility];  Service: Orthopedics;  Laterality: Right;  RIGHT GASTROC RECESSION, SUBTALAR ARTHRODESIS, TALAR NAVICULAR ARTHRODESIS   Patient Active Problem List   Diagnosis Date Noted   Right lumbar radiculitis 02/24/2024   Bright red blood per rectum 02/24/2024   Left hamstring muscle strain 03/23/2021   Elevated liver enzymes 02/06/2019   IFG (impaired fasting glucose) 02/06/2019   Hypertriglyceridemia, familial 06/08/2017   Blind left eye 06/07/2017    PCP: Agapito Games, MD  REFERRING PROVIDER: Monica Becton, MD  REFERRING DIAG: M54.16 (ICD-10-CM) - Right lumbar radiculitis  Rationale for Evaluation and Treatment: Rehabilitation  THERAPY DIAG:  Other low back pain  Abnormal posture  ONSET DATE: beginning of February   SUBJECTIVE:                                                                                                                                                                                           SUBJECTIVE STATEMENT: Pt states he woke up with pain after sleeping on couch. Thought it would self resolve but so far hasn't. Sometimes has difficulty standing straight. Occasionally  has pain into RLE. No L sided symptoms. Does note that distal symptoms seem to be improved some since onset, although leg does buckle occasionally (about once a day, not worsening with time).  R LE sometimes will sometimes feel like leg going to sleep. No fevers/chills, no bowel/bladder changes.  No saddle anesthesia.   PERTINENT HISTORY:  R ankle fusion   PAIN:  Are you having pain: yes, 5-6/10 Location/description: R low back and hip Best-worst over past week: 3-9/10  - aggravating factors: bending, rotating, heavier lifting, lying on R side, getting in/out of bed, walking/standing ~73min - Easing  factors: medication, changes positions, stretching   PRECAUTIONS: none   WEIGHT BEARING RESTRICTIONS: No  FALLS:  Has patient fallen in last 6 months? No  LIVING ENVIRONMENT: 2 story house, bed/bath upstairs (~12 steps R rail), 1 STE Lives w/ wife and kids Housework split  OCCUPATION: works third shift, Curator ; has to do some lifting  PLOF: Independent - enjoys playing basketball  PATIENT GOALS: get back to basketball, be able to sit better  NEXT MD VISIT: April   OBJECTIVE:  Note: Objective measures were completed at Evaluation unless otherwise noted.  DIAGNOSTIC FINDINGS:  02/24/24 Lumbar XR pending per review of EPIC  PATIENT SURVEYS:  Modified Oswestry 17/50 ; 34%   COGNITION: Overall cognitive status: Within functional limits for tasks assessed     SENSATION/NEURO: Light touch intact BIL LE No clonus either LE Negative hoffmann and tromner sign BIL No ataxia with gait   POSTURE: fwd trunk lean in sitting w/ hands resting on knees, noted shifting of weight laterally on pelvis throughout subjective   LUMBAR ROM:   AROM eval  Flexion Able to touch toes with increased pain (begins around knee but able to push to toes)  Extension <25%   Right lateral flexion Just past knee, "relieving hurt"  Left lateral flexion Past knee  Right rotation 100%  Left rotation  100%   (Blank rows = not tested) (Key: WFL = within functional limits not formally assessed, * = concordant pain, s = stiffness/stretching sensation, NT = not tested) Comment: improved tolerance w/ retesting flexion after 5 reps repeated extension   LOWER EXTREMITY ROM:     Active  Right eval Left eval  Hip flexion    Hip extension    Hip internal rotation    Hip external rotation    Knee extension    Knee flexion    (Blank rows = not tested) (Key: WFL = within functional limits not formally assessed, * = concordant pain, s = stiffness/stretching sensation, NT = not tested)  Comments:    LOWER EXTREMITY MMT:    MMT Right eval Left eval  Hip flexion 3+ 4  Hip abduction (modified sitting)    Hip internal rotation 4+ 5  Hip external rotation 4+ 4+  Knee flexion 5 5  Knee extension 4+ 5  Ankle dorsiflexion 4+ (limited ROM) 5   (Blank rows = not tested) (Key: WFL = within functional limits not formally assessed, * = concordant pain, s = stiffness/stretching sensation, NT = not tested)  Comments:    LUMBAR SPECIAL TESTS:  + slump bilaterally, L slump elicits R sided pain  FUNCTIONAL TESTS:  5xSTS: 17.29sec no UE support, mild hip pain   GAIT: Distance walked: within clinic Assistive device utilized: None Level of assistance: Complete Independence Comments: reduced gait speed/cadence, reduced truncal rotation  TREATMENT DATE:  [facility] Adult PT Treatment:                                                DATE: 02/29/24 Therapeutic Exercise: STS x5 Lumbar extension w/ wall support x10 cues for comfortable ROM and breath control, monitoring symptoms and appropriate modifications as needed HEP handout + education, relevant anatomy/physiology, rationale for interventions, limiting flexion in short term  PATIENT EDUCATION:  Education details: Pt education  on PT impairments, prognosis, and POC. Informed consent. Rationale for interventions, safe/appropriate HEP performance Person educated: Patient Education method: Explanation, Demonstration, Tactile cues, Verbal cues Education comprehension: verbalized understanding, returned demonstration, verbal cues required, tactile cues required, and needs further education    HOME EXERCISE PROGRAM: Access Code: [email or web address] URL: [email or web address] Date: 02/29/2024 Prepared by: Fransisco Hertz  Exercises - Sit to Stand  - 2-3 x daily - 1 sets - 5 reps - Standing Lumbar Extension at Wall  - 2-3 x daily - 1 sets - 8-10 reps  ASSESSMENT:  CLINICAL IMPRESSION: Patient is a pleasant 48 y.o. gentleman who was seen today for physical therapy evaluation and treatment for back pain ongoing for ~1 month. Pt endorses limitations in positional tolerance, standing/walking, heavier lifting, and bending/twisting. On exam he demonstrates concordant limitations in lumbar mobility, LE strength. 5xSTS is outside of age norm and indicative of fall risk. While extension is initially painful, tolerance improves w/ repetition and pt has notable improvement in flexion tolerance after repeated movements into extension. Tolerates exam/HEP well overall with no adverse events. Recommend trial of skilled PT to address aforementioned deficits with aim of improving functional tolerance and reducing pain with typical activities. Pt departs today's session in no acute distress, all voiced concerns/questions addressed appropriately from PT perspective.      OBJECTIVE IMPAIRMENTS: Abnormal gait, decreased endurance, decreased mobility, difficulty walking, decreased ROM, decreased strength, improper body mechanics, postural dysfunction, and pain.   ACTIVITY LIMITATIONS: carrying, lifting, bending, sitting, standing, squatting, sleeping, stairs, and locomotion level  PARTICIPATION LIMITATIONS: meal prep, cleaning, laundry,  shopping, community activity, and occupation  PERSONAL FACTORS: Time since onset of injury/illness/exacerbation and 1 comorbidity: prior R ankle fusion  are also affecting patient's functional outcome.   REHAB POTENTIAL: Good  CLINICAL DECISION MAKING: Evolving/moderate complexity  EVALUATION COMPLEXITY: Moderate   GOALS:  SHORT TERM GOALS: Target date: 03/21/2024 Pt will demonstrate appropriate understanding and performance of initially prescribed HEP in order to facilitate improved independence with management of symptoms.  Baseline: HEP TBD  Goal status: INITIAL   2. Pt will report at least 25% improvement in overall pain levels over past week in order to facilitate improved tolerance to typical daily activities.   Baseline: 3-9/10  Goal status: INITIAL    LONG TERM GOALS: Target date: 04/11/2024   Pt will improve at least 20% on ODI in order to demonstrate improved perception of functional status due to symptoms.  Baseline: 34% Goal status: INITIAL  2.  Pt will demonstrate full and painless lumbar flexion AROM in order to demonstrate improved tolerance to functional movement patterns.   Baseline: see ROM chart above Goal status: INITIAL  3.  Pt will demonstrate bilateral hip ER/IR/flexion MMT of at least 4+/5 in order to demonstrate improved strength for functional movements.  Baseline: see MMT chart above Goal status: INITIAL  4. Pt will perform 5xSTS in <10 sec in order to demonstrate reduced fall risk and improved functional independence. (MCID of 2.3sec, age cohort norm 6.2+/-1.3sec per Billie Ruddy et al 2007)   Baseline: 17.29sec no UE support, pain  Goal status: INITIAL   5. Pt will report at least 50% decrease in overall pain levels in past week in order to facilitate improved tolerance to basic ADLs/mobility.   Baseline:  3-9/10  Goal status: INITIAL     PLAN:  PT FREQUENCY: 1-2x/week  PT DURATION: 6 weeks  PLANNED INTERVENTIONS: 97164- PT Re-evaluation,  97110-Therapeutic exercises, 97530- Therapeutic activity,  16109- Neuromuscular re-education, [phone]- Self Care, 09811- Manual therapy, [phone]- Gait training, [phone]- Aquatic Therapy, Patient/Family education, Balance training, Stair training, Taping, Dry Needling, Joint mobilization, Joint manipulation, Spinal manipulation, Spinal mobilization, Cryotherapy, and Moist heat.  PLAN FOR NEXT SESSION: Review/update HEP PRN. Work on [employer] exercises as appropriate with emphasis on repeated extension initially to improve flexion tolerance. Otherwise working on core/hip strength/stability, functional kinematics. Symptom modification strategies as indicated/appropriate.    Ashley Murrain PT, DPT 02/29/2024 11:32 AM    For all possible CPT codes, reference the Planned Interventions line above.     Check all conditions that are expected to impact treatment: {Conditions expected to impact treatment:Musculoskeletal disorders   If treatment provided at initial evaluation, no treatment charged due to lack of authorization.

## 2024-02-29 ENCOUNTER — Encounter: Payer: Self-pay | Admitting: Physical Therapy

## 2024-02-29 ENCOUNTER — Other Ambulatory Visit: Payer: Self-pay

## 2024-02-29 ENCOUNTER — Ambulatory Visit: Attending: Sports Medicine | Admitting: Physical Therapy

## 2024-02-29 DIAGNOSIS — R293 Abnormal posture: Secondary | ICD-10-CM | POA: Insufficient documentation

## 2024-02-29 DIAGNOSIS — M5459 Other low back pain: Secondary | ICD-10-CM | POA: Diagnosis not present

## 2024-02-29 DIAGNOSIS — M5416 Radiculopathy, lumbar region: Secondary | ICD-10-CM | POA: Diagnosis not present

## 2024-04-06 ENCOUNTER — Encounter: Payer: Self-pay | Admitting: Sports Medicine

## 2024-04-06 ENCOUNTER — Ambulatory Visit (INDEPENDENT_AMBULATORY_CARE_PROVIDER_SITE_OTHER): Payer: BC Managed Care – PPO | Admitting: Sports Medicine

## 2024-04-06 DIAGNOSIS — K625 Hemorrhage of anus and rectum: Secondary | ICD-10-CM | POA: Diagnosis not present

## 2024-04-06 DIAGNOSIS — M5416 Radiculopathy, lumbar region: Secondary | ICD-10-CM | POA: Diagnosis not present

## 2024-04-06 MED ORDER — MELOXICAM 15 MG PO TABS: ORAL_TABLET | ORAL | 3 refills | Status: DC

## 2024-04-06 NOTE — Assessment & Plan Note (Signed)
 Resolved, of note colonoscopy was this year.

## 2024-04-06 NOTE — Assessment & Plan Note (Addendum)
 This very pleasant 48 year old male returns, we last saw him about 6 weeks ago, he had increasing axial low back pain, discogenic with radiation down the back of the right leg to the bottom of the right foot in the middle 3 toes all consistent with a right L5 distribution radiculitis. Minimal weakness to dorsiflexion right ankle. He was status post lower extremity surgery and the weakness was baseline for him. We treated conservatively with prednisone, Flexeril, we got x-rays and formal PT. He did a few sessions formal PT and then transition to home exercise program, he returns today really not doing a lot better, continues to have axial discogenic low back pain with radiation down into an L5 distribution, at this point due to failure of 6 weeks of conservative treatment we will proceed with MRI for epidural planning, likely L5-S1 transforaminal. Meloxicam for pain in the meantime. We will order the epidural once we see the MRI results.

## 2024-04-06 NOTE — Progress Notes (Signed)
    Procedures performed today:    None.  Independent interpretation of notes and tests performed by another provider:   None.  Brief History, Exam, Impression, and Recommendations:    Bright red blood per rectum Resolved, of note colonoscopy was this year.  Right lumbar radiculitis This very pleasant 48 year old male returns, we last saw him about 6 weeks ago, he had increasing axial low back pain, discogenic with radiation down the back of the right leg to the bottom of the right foot in the middle 3 toes all consistent with a right L5 distribution radiculitis. Minimal weakness to dorsiflexion right ankle. He was status post lower extremity surgery and the weakness was baseline for him. We treated conservatively with prednisone, Flexeril, we got x-rays and formal PT. He did a few sessions formal PT and then transition to home exercise program, he returns today really not doing a lot better, continues to have axial discogenic low back pain with radiation down into an L5 distribution, at this point due to failure of 6 weeks of conservative treatment we will proceed with MRI for epidural planning, likely L5-S1 transforaminal. Meloxicam for pain in the meantime. We will order the epidural once we see the MRI results.    ____________________________________________ [name]. Benjamin Stain, M.D., ABFM., CAQSM., AME. Primary Care and Sports Medicine [facility]  Adjunct Professor of Family Medicine  [employer]  Restaurant manager, fast food

## 2024-04-19 ENCOUNTER — Encounter (HOSPITAL_COMMUNITY): Payer: Self-pay | Admitting: Emergency Medicine

## 2024-04-19 ENCOUNTER — Emergency Department (HOSPITAL_COMMUNITY)
Admission: EM | Admit: 2024-04-19 | Discharge: 2024-04-19 | Disposition: A | Discharge status: Home / Self Care | Attending: Emergency Medicine | Admitting: Emergency Medicine

## 2024-04-19 ENCOUNTER — Emergency Department (HOSPITAL_COMMUNITY)

## 2024-04-19 ENCOUNTER — Other Ambulatory Visit: Payer: Self-pay

## 2024-04-19 DIAGNOSIS — K5792 Diverticulitis of intestine, part unspecified, without perforation or abscess without bleeding: Secondary | ICD-10-CM | POA: Diagnosis not present

## 2024-04-19 DIAGNOSIS — J9811 Atelectasis: Secondary | ICD-10-CM | POA: Diagnosis not present

## 2024-04-19 DIAGNOSIS — I10 Essential (primary) hypertension: Secondary | ICD-10-CM | POA: Diagnosis not present

## 2024-04-19 DIAGNOSIS — K5732 Diverticulitis of large intestine without perforation or abscess without bleeding: Secondary | ICD-10-CM | POA: Insufficient documentation

## 2024-04-19 DIAGNOSIS — Z0389 Encounter for observation for other suspected diseases and conditions ruled out: Secondary | ICD-10-CM | POA: Diagnosis not present

## 2024-04-19 DIAGNOSIS — R030 Elevated blood-pressure reading, without diagnosis of hypertension: Secondary | ICD-10-CM | POA: Insufficient documentation

## 2024-04-19 DIAGNOSIS — R1032 Left lower quadrant pain: Secondary | ICD-10-CM | POA: Diagnosis not present

## 2024-04-19 LAB — URINALYSIS, W/ REFLEX TO CULTURE (INFECTION SUSPECTED)
Bacteria, UA: NONE SEEN
Bilirubin Urine: NEGATIVE
Glucose, UA: NEGATIVE mg/dL
Hgb urine dipstick: NEGATIVE
Ketones, ur: 5 mg/dL — AB
Leukocytes,Ua: NEGATIVE
Nitrite: NEGATIVE
Protein, ur: NEGATIVE mg/dL
Specific Gravity, Urine: 1.021 (ref 1.005–1.030)
pH: 6 (ref 5.0–8.0)

## 2024-04-19 LAB — COMPREHENSIVE METABOLIC PANEL WITH GFR
ALT: 54 U/L — ABNORMAL HIGH (ref 0–44)
AST: 23 U/L (ref 15–41)
Albumin: 4.3 g/dL (ref 3.5–5.0)
Alkaline Phosphatase: 72 U/L (ref 38–126)
Anion gap: 8 (ref 5–15)
BUN: 12 mg/dL (ref 6–20)
CO2: 23 mmol/L (ref 22–32)
Calcium: 9.1 mg/dL (ref 8.9–10.3)
Chloride: 105 mmol/L (ref 98–111)
Creatinine, Ser: 1 mg/dL (ref 0.61–1.24)
GFR, Estimated: >60 mL/min (ref >60)
Glucose, Bld: 123 mg/dL — ABNORMAL HIGH (ref 70–99)
Potassium: 3.8 mmol/L (ref 3.5–5.1)
Sodium: 136 mmol/L (ref 135–145)
Total Bilirubin: 1.6 mg/dL — ABNORMAL HIGH (ref 0.0–1.2)
Total Protein: 7.5 g/dL (ref 6.5–8.1)

## 2024-04-19 LAB — CBC WITH DIFFERENTIAL/PLATELET
Abs Immature Granulocytes: 0.06 10*3/uL (ref 0.00–0.07)
Basophils Absolute: 0.1 10*3/uL (ref 0.0–0.1)
Basophils Relative: 0 %
Eosinophils Absolute: 0 10*3/uL (ref 0.0–0.5)
Eosinophils Relative: 0 %
HCT: 43.4 % (ref 39.0–52.0)
Hemoglobin: 15.1 g/dL (ref 13.0–17.0)
Immature Granulocytes: 0 %
Lymphocytes Relative: 6 %
Lymphs Abs: 0.9 10*3/uL (ref 0.7–4.0)
MCH: 30.6 pg (ref 26.0–34.0)
MCHC: 34.8 g/dL (ref 30.0–36.0)
MCV: 88 fL (ref 80.0–100.0)
Monocytes Absolute: 0.8 10*3/uL (ref 0.1–1.0)
Monocytes Relative: 6 %
Neutro Abs: 12.2 10*3/uL — ABNORMAL HIGH (ref 1.7–7.7)
Neutrophils Relative %: 88 %
Platelets: 281 10*3/uL (ref 150–400)
RBC: 4.93 MIL/uL (ref 4.22–5.81)
RDW: 12.7 % (ref 11.5–15.5)
WBC: 14.1 10*3/uL — ABNORMAL HIGH (ref 4.0–10.5)
nRBC: 0 % (ref 0.0–0.2)

## 2024-04-19 LAB — PROTIME-INR
INR: 1 (ref 0.8–1.2)
Prothrombin Time: 13.8 s (ref 11.4–15.2)

## 2024-04-19 LAB — I-STAT CG4 LACTIC ACID, ED: Lactic Acid, Venous: 1.5 mmol/L (ref 0.5–1.9)

## 2024-04-19 MED ORDER — LACTATED RINGERS IV BOLUS
1000.0000 mL | Freq: Once | INTRAVENOUS | Status: AC
  Administered 2024-04-19: 1000 mL via INTRAVENOUS

## 2024-04-19 MED ORDER — PIPERACILLIN-TAZOBACTAM 3.375 G IVPB 30 MIN
3.3750 g | Freq: Once | INTRAVENOUS | Status: AC
  Administered 2024-04-19: 3.375 g via INTRAVENOUS
  Filled 2024-04-19: qty 50

## 2024-04-19 MED ORDER — ONDANSETRON 8 MG PO TBDP: 8.0000 mg | ORAL_TABLET | Freq: Three times a day (TID) | ORAL | 0 refills | Status: DC | PRN

## 2024-04-19 MED ORDER — OXYCODONE-ACETAMINOPHEN 5-325 MG PO TABS: 1.0000 | ORAL_TABLET | Freq: Four times a day (QID) | ORAL | 0 refills | Status: DC | PRN

## 2024-04-19 MED ORDER — MORPHINE SULFATE (PF) 4 MG/ML IV SOLN
4.0000 mg | Freq: Once | INTRAVENOUS | Status: AC
  Administered 2024-04-19: 4 mg via INTRAVENOUS
  Filled 2024-04-19: qty 1

## 2024-04-19 MED ORDER — ONDANSETRON HCL 4 MG/2ML IJ SOLN
4.0000 mg | Freq: Once | INTRAMUSCULAR | Status: AC
  Administered 2024-04-19: 4 mg via INTRAVENOUS
  Filled 2024-04-19: qty 2

## 2024-04-19 MED ORDER — IOHEXOL 300 MG/ML  SOLN
100.0000 mL | Freq: Once | INTRAMUSCULAR | Status: AC | PRN
  Administered 2024-04-19: 100 mL via INTRAVENOUS

## 2024-04-19 MED ORDER — AMOXICILLIN-POT CLAVULANATE 875-125 MG PO TABS: 1.0000 | ORAL_TABLET | Freq: Two times a day (BID) | ORAL | 0 refills | Status: DC

## 2024-04-19 NOTE — ED Triage Notes (Addendum)
 Pt in with sharp LLQ pain x days, also has fever of 100.7 in triage. Pt has been feeling twinge pain to L abdomen a few days but today is unable to walk and febrile. Dx with diverticulosis earlier this year. Denies any n/v/d

## 2024-04-19 NOTE — Discharge Instructions (Addendum)
 It was our pleasure to provide your ER care today - we hope that you feel better.  Drink plenty of fluids/stay well hydrated. Take antibiotic (augmentin ) as prescribed. Take zofran  as need for nausea. Take motrin or aleve as need for pain. You may also take percocet as need for pain. No driving for the next 6 hours or when taking percocet. Also, do not take tylenol  or acetaminophen  containing medication when taking percocet.   Follow up with primary care doctor in the coming week for recheck. Also discuss with your doctor referral to GI specialist for possible colonoscopy after symptoms have resolved (to exclude any underlying mass). Also follow up closely with primary care doctor in the next 1-2 weeks regarding your blood pressure that is high today.   Your blood pressure is high today - follow up with primary care doctor in 1-2 weeks.   Return to ER right away if worse, new symptoms, worsening or severe  or intractable abdominal pain, persistent vomiting, fainting, trouble breathing, or other concern.

## 2024-04-19 NOTE — ED Provider Notes (Signed)
 [place] EMERGENCY DEPARTMENT AT [facility] Provider Note   CSN: [id number] Arrival date & time: 04/19/24  1945     History  Chief Complaint  Patient presents with   Abdominal Pain   Fever    Ryan Vargas is a 48 y.o. male.  Patient with c/o LLQ abd pain since last night, constant, dull, non radiating. Notes one prior episodes diverticulitis. Denies scrotal or testicular pain or swelling. No back/flank pain. No dysuria, hematuria or gu c/o. Nausea, no vomiting. Has been having normal bms. Low grade fever in ED.  The history is provided by the patient and medical records.  Abdominal Pain Associated symptoms: fever and nausea   Associated symptoms: no chest pain, no constipation, no cough, no diarrhea, no dysuria, no shortness of breath, no sore throat and no vomiting   Fever Associated symptoms: nausea   Associated symptoms: no chest pain, no cough, no diarrhea, no dysuria, no headaches, no rash, no sore throat and no vomiting        Home Medications Prior to Admission medications   Medication Sig Start Date End Date Taking? Authorizing Provider  amoxicillin -clavulanate (AUGMENTIN ) 875-125 MG tablet Take 1 tablet by mouth every 12 (twelve) hours. 04/19/24  Yes Guadalupe Lee, MD  ondansetron  (ZOFRAN -ODT) 8 MG disintegrating tablet Take 1 tablet (8 mg total) by mouth every 8 (eight) hours as needed for nausea or vomiting. 04/19/24  Yes Kenli Waldo, MD  oxyCODONE -acetaminophen  (PERCOCET/ROXICET) 5-325 MG tablet Take 1-2 tablets by mouth every 6 (six) hours as needed for severe pain (pain score 7-10). 04/19/24  Yes Guadalupe Lee, MD  cyclobenzaprine  (FLEXERIL ) 10 MG tablet One half to one tab PO qHS, then increase gradually to one tab TID. Patient not taking: Reported on 04/06/2024 02/24/24   Gean Keels, MD  docusate sodium  (COLACE) 100 MG capsule Take 1 capsule (100 mg total) by mouth 3 (three) times daily as needed. 02/24/24   Gean Keels, MD   hydrocortisone  (ANUSOL -HC) 25 MG suppository Place 1 suppository (25 mg total) rectally 2 (two) times daily as needed for hemorrhoids. 02/24/24   Gean Keels, MD  icosapent Ethyl (VASCEPA) 1 g capsule Take 2 capsules (2 g total) by mouth 2 (two) times daily. 08/27/22   Cydney Draft, MD  meloxicam  (MOBIC ) 15 MG tablet One tab PO every 24 hours with a meal for 2 weeks, then once every 24 hours prn pain. 04/06/24   Gean Keels, MD  witch hazel-glycerin  (TUCKS) pad Apply 1 Application topically as needed. 02/24/24   Gean Keels, MD      Allergies    Pollen extract    Review of Systems   Review of Systems  Constitutional:  Positive for fever.  HENT:  Negative for sore throat.   Respiratory:  Negative for cough and shortness of breath.   Cardiovascular:  Negative for chest pain.  Gastrointestinal:  Positive for abdominal pain and nausea. Negative for constipation, diarrhea and vomiting.  Genitourinary:  Negative for dysuria and flank pain.  Musculoskeletal:  Negative for back pain.  Skin:  Negative for rash.  Neurological:  Negative for headaches.    Physical Exam Updated Vital Signs BP (!) 165/99   Pulse 80   Temp (!) 100.7 F (38.2 C) (Oral)   Resp 16   Wt 115.7 kg   SpO2 100%   BMI 32.74 kg/m  Physical Exam Vitals and nursing note reviewed.  Constitutional:      Appearance: Normal appearance.  He is well-developed.  HENT:     Head: Atraumatic.     Nose: Nose normal.     Mouth/Throat:     Mouth: Mucous membranes are moist.  Eyes:     General: No scleral icterus.    Conjunctiva/sclera: Conjunctivae normal.  Neck:     Trachea: No tracheal deviation.     Comments: No stiffness or rigidity Cardiovascular:     Rate and Rhythm: Normal rate and regular rhythm.     Pulses: Normal pulses.     Heart sounds: Normal heart sounds. No murmur heard.    No friction rub. No gallop.  Pulmonary:     Effort: Pulmonary effort is normal. No accessory  muscle usage or respiratory distress.     Breath sounds: Normal breath sounds.  Abdominal:     General: Bowel sounds are normal. There is no distension.     Palpations: Abdomen is soft.     Tenderness: There is abdominal tenderness. There is no guarding.     Comments: LLQ tenderness.   Genitourinary:    Comments: No cva tenderness. Musculoskeletal:        General: No swelling or tenderness.     Cervical back: Normal range of motion and neck supple. No rigidity.  Skin:    General: Skin is warm and dry.     Findings: No rash.  Neurological:     Mental Status: He is alert.     Comments: Alert, speech clear.   Psychiatric:        Mood and Affect: Mood normal.     ED Results / Procedures / Treatments   Labs (all labs ordered are listed, but only abnormal results are displayed) Results for orders placed or performed during the hospital encounter of 04/19/24  Protime-INR   Collection Time: 04/19/24  8:17 PM  Result Value Ref Range   Prothrombin Time 13.8 11.4 - 15.2 seconds   INR 1.0 0.8 - 1.2  Comprehensive metabolic panel   Collection Time: 04/19/24  8:17 PM  Result Value Ref Range   Sodium 136 135 - 145 mmol/L   Potassium 3.8 3.5 - 5.1 mmol/L   Chloride 105 98 - 111 mmol/L   CO2 23 22 - 32 mmol/L   Glucose, Bld 123 (H) 70 - 99 mg/dL   BUN 12 6 - 20 mg/dL   Creatinine, Ser 1.61 0.61 - 1.24 mg/dL   Calcium  9.1 8.9 - 10.3 mg/dL   Total Protein 7.5 6.5 - 8.1 g/dL   Albumin 4.3 3.5 - 5.0 g/dL   AST 23 15 - 41 U/L   ALT 54 (H) 0 - 44 U/L   Alkaline Phosphatase 72 38 - 126 U/L   Total Bilirubin 1.6 (H) 0.0 - 1.2 mg/dL   GFR, Estimated >09 >60 mL/min   Anion gap 8 5 - 15  CBC with Differential   Collection Time: 04/19/24  8:17 PM  Result Value Ref Range   WBC 14.1 (H) 4.0 - 10.5 K/uL   RBC 4.93 4.22 - 5.81 MIL/uL   Hemoglobin 15.1 13.0 - 17.0 g/dL   HCT 45.4 09.8 - 11.9 %   MCV 88.0 80.0 - 100.0 fL   MCH 30.6 26.0 - 34.0 pg   MCHC 34.8 30.0 - 36.0 g/dL   RDW 14.7 82.9  - 56.2 %   Platelets 281 150 - 400 K/uL   nRBC 0.0 0.0 - 0.2 %   Neutrophils Relative % 88 %   Neutro Abs 12.2 (H) 1.7 -  7.7 K/uL   Lymphocytes Relative 6 %   Lymphs Abs 0.9 0.7 - 4.0 K/uL   Monocytes Relative 6 %   Monocytes Absolute 0.8 0.1 - 1.0 K/uL   Eosinophils Relative 0 %   Eosinophils Absolute 0.0 0.0 - 0.5 K/uL   Basophils Relative 0 %   Basophils Absolute 0.1 0.0 - 0.1 K/uL   Immature Granulocytes 0 %   Abs Immature Granulocytes 0.06 0.00 - 0.07 K/uL  I-Stat Lactic Acid, ED   Collection Time: 04/19/24  8:25 PM  Result Value Ref Range   Lactic Acid, Venous 1.5 0.5 - 1.9 mmol/L  Urinalysis, w/ Reflex to Culture (Infection Suspected) -Urine, Clean Catch   Collection Time: 04/19/24  8:36 PM  Result Value Ref Range   Specimen Source URINE, CLEAN CATCH    Color, Urine YELLOW YELLOW   APPearance CLEAR CLEAR   Specific Gravity, Urine 1.021 1.005 - 1.030   pH 6.0 5.0 - 8.0   Glucose, UA NEGATIVE NEGATIVE mg/dL   Hgb urine dipstick NEGATIVE NEGATIVE   Bilirubin Urine NEGATIVE NEGATIVE   Ketones, ur 5 (A) NEGATIVE mg/dL   Protein, ur NEGATIVE NEGATIVE mg/dL   Nitrite NEGATIVE NEGATIVE   Leukocytes,Ua NEGATIVE NEGATIVE   RBC / HPF 0-5 0 - 5 RBC/hpf   WBC, UA 0-5 0 - 5 WBC/hpf   Bacteria, UA NONE SEEN NONE SEEN   Squamous Epithelial / HPF 0-5 0 - 5 /HPF   Mucus PRESENT       EKG None  Radiology CT ABDOMEN PELVIS W CONTRAST Result Date: 04/19/2024 CLINICAL DATA:  LLQ abdominal pain EXAM: CT ABDOMEN AND PELVIS WITH CONTRAST TECHNIQUE: Multidetector CT imaging of the abdomen and pelvis was performed using the standard protocol following bolus administration of intravenous contrast. RADIATION DOSE REDUCTION: This exam was performed according to the departmental dose-optimization program which includes automated exposure control, adjustment of the mA and/or kV according to patient size and/or use of iterative reconstruction technique. CONTRAST:  OMNIPAQUE  IOHEXOL  300  MG/ML  SOLN COMPARISON:  CT abdomen pelvis 09/01/2023 FINDINGS: Lower chest: Bilateral lower lobe atelectasis. No acute abnormality. Hepatobiliary: No focal liver abnormality. No gallstones, gallbladder wall thickening, or pericholecystic fluid. No biliary dilatation. Pancreas: No focal lesion. Normal pancreatic contour. No surrounding inflammatory changes. No main pancreatic ductal dilatation. Spleen: Normal in size without focal abnormality. Adrenals/Urinary Tract: No adrenal nodule bilaterally. Bilateral kidneys enhance symmetrically. No hydronephrosis. No hydroureter. The urinary bladder is unremarkable. Stomach/Bowel: Stomach is within normal limits. No evidence of small bowel wall thickening or dilatation. Bowel thickening and pericolonic fat stranding surrounding a focal diverticula of the proximal mid sigmoid colon. Appendix appears normal. Vascular/Lymphatic: No abdominal aorta or iliac aneurysm. Mild atherosclerotic plaque of the aorta and its branches. No abdominal, pelvic, or inguinal lymphadenopathy. Reproductive: Prostate is unremarkable. Other: No intraperitoneal free fluid. No intraperitoneal free gas. No organized fluid collection. Musculoskeletal: No abdominal wall hernia or abnormality. No suspicious lytic or blastic osseous lesions. No acute displaced fracture. Multilevel degenerative changes of the spine. IMPRESSION: 1. Colonic diverticulosis with acute uncomplicated diverticulitis of the proximal to mid sigmoid colon. Recommend colonoscopy status post treatment and status post complete resolution of inflammatory changes to exclude an underlying lesion. 2.  Aortic Atherosclerosis (ICD10-I70.0). Electronically Signed   By: Morgane  Naveau M.D.   On: 04/19/2024 22:30   DG Chest Port 1 View Result Date: 04/19/2024 CLINICAL DATA:  [id number] Suspected sepsis [id number] EXAM: PORTABLE CHEST 1 VIEW COMPARISON:  CT abdomen pelvis 04/19/2024 FINDINGS:  Prominent cardiac silhouette likely due to AP portable  technique. The heart and mediastinal contours are otherwise within normal limits. Bibasilar atelectasis. No focal consolidation. No pulmonary edema. No pleural effusion. No pneumothorax. No acute osseous abnormality. IMPRESSION: No active disease. Electronically Signed   By: Morgane  Naveau M.D.   On: 04/19/2024 21:47    Procedures Procedures    Medications Ordered in ED Medications  lactated ringers  bolus 1,000 mL (1,000 mLs Intravenous Bolus 04/19/24 2108)  piperacillin -tazobactam (ZOSYN ) IVPB 3.375 g (0 g Intravenous Stopped 04/19/24 2243)  morphine  (PF) 4 MG/ML injection 4 mg (4 mg Intravenous Given 04/19/24 2106)  ondansetron  (ZOFRAN ) injection 4 mg (4 mg Intravenous Given 04/19/24 2105)  iohexol  (OMNIPAQUE ) 300 MG/ML solution 100 mL (100 mLs Intravenous Contrast Given 04/19/24 2134)    ED Course/ Medical Decision Making/ A&P                                 Medical Decision Making Problems Addressed: Acute diverticulitis: acute illness or injury with systemic symptoms that poses a threat to life or bodily functions Elevated blood pressure reading: acute illness or injury  Amount and/or Complexity of Data Reviewed Independent Historian:     Details: Family, hx External Data Reviewed: notes. Labs: ordered. Decision-making details documented in ED Course. Radiology: ordered and independent interpretation performed. Decision-making details documented in ED Course.  Risk Prescription drug management. Parenteral controlled substances. Decision regarding hospitalization.   Iv ns. Continuous pulse ox and cardiac monitoring. Labs ordered/sent. Imaging ordered.   Differential diagnosis includes diverticulitis, appendicitis, uti/pyelo, etc. Dispo decision including potential need for admission considered - will get labs and imaging and reassess.   Reviewed nursing notes and prior charts for additional history. External reports reviewed.   LR bolus. Morphine  iv. Zofran  iv. Zosyn  iv.    Cardiac monitor: sinus rhythm, rate 90.  Labs reviewed/interpreted by me - wbc 14, hgb 15. Lactate normal. Chem unremarkable. Ua neg for uti.   CT reviewed/interpreted by me - diverticulitis, no abscess.   Recheck, feels improved. No nv. No peritoneal signs. No nv.   Pt currently appears stable for d/c.   Rec close pcp f/u.  Return precautions provided.    (EPIC/computer not allowing digital signature of percocet/opiate meds, will provide paper rx). Abx and zofran  were able to be sent electronically.           Final Clinical Impression(s) / ED Diagnoses Final diagnoses:  Acute diverticulitis  Elevated blood pressure reading    Rx / DC Orders ED Discharge Orders          Ordered    amoxicillin -clavulanate (AUGMENTIN ) 875-125 MG tablet  Every 12 hours        04/19/24 2304    ondansetron  (ZOFRAN -ODT) 8 MG disintegrating tablet  Every 8 hours PRN        04/19/24 2304    oxyCODONE -acetaminophen  (PERCOCET/ROXICET) 5-325 MG tablet  Every 6 hours PRN        04/19/24 2304              Belva Koziel, MD 04/19/24 2306

## 2024-04-19 NOTE — ED Provider Triage Note (Signed)
 Emergency Medicine Provider Triage Evaluation Note  Ryan Vargas , a 48 y.o. male  was evaluated in triage.  Pt complains of abdominal pain. Reports LLQ pain for several days with no improvement. Feels abdomen is distended. Has had difficulty with bowel movements and denies any vomiting. No history of similar symptoms although was diagnosed with diverticulosis earlier this year. Began experiencing fevers earlier today. Pain making it difficult to walk. No GU symptoms.  Review of Systems  Positive: As above Negative: As above  Physical Exam  BP (!) 160/93   Pulse 92   Temp (!) 100.7 F (38.2 C) (Oral)   Resp (!) 21   Wt 115.7 kg   SpO2 99%   BMI 32.74 kg/m  Gen:   Awake, uncomfortable Resp:  Normal effort  MSK:   Moves extremities without difficulty  Other:  Significant TTP in the LLQ. Abdomen distended. Decreased bowel sounds.  Medical Decision Making  Medically screening exam initiated at 8:24 PM.  Appropriate orders placed.  Ryan Vargas was informed that the remainder of the evaluation will be completed by another provider, this initial triage assessment does not replace that evaluation, and the importance of remaining in the ED until their evaluation is complete.  Sepsis labs ordered. Added CT abdomen and pelvis imaging.   Laydon Martis A, PA-C 04/19/24 2026

## 2024-04-24 LAB — CULTURE, BLOOD (ROUTINE X 2)
Culture: NO GROWTH
Culture: NO GROWTH
Special Requests: ADEQUATE

## 2024-04-25 ENCOUNTER — Other Ambulatory Visit: Payer: Self-pay

## 2024-04-25 ENCOUNTER — Inpatient Hospital Stay (HOSPITAL_COMMUNITY)
Admission: EM | Admit: 2024-04-25 | Discharge: 2024-04-30 | DRG: 392 | Disposition: A | Discharge status: Home / Self Care | Attending: Family Medicine | Admitting: Family Medicine

## 2024-04-25 DIAGNOSIS — Z6830 Body mass index (BMI) 30.0-30.9, adult: Secondary | ICD-10-CM | POA: Diagnosis not present

## 2024-04-25 DIAGNOSIS — R1032 Left lower quadrant pain: Secondary | ICD-10-CM | POA: Diagnosis not present

## 2024-04-25 DIAGNOSIS — E66811 Obesity, class 1: Secondary | ICD-10-CM | POA: Diagnosis present

## 2024-04-25 DIAGNOSIS — Z833 Family history of diabetes mellitus: Secondary | ICD-10-CM

## 2024-04-25 DIAGNOSIS — Z8249 Family history of ischemic heart disease and other diseases of the circulatory system: Secondary | ICD-10-CM | POA: Diagnosis not present

## 2024-04-25 DIAGNOSIS — K5732 Diverticulitis of large intestine without perforation or abscess without bleeding: Secondary | ICD-10-CM | POA: Diagnosis not present

## 2024-04-25 DIAGNOSIS — H5462 Unqualified visual loss, left eye, normal vision right eye: Secondary | ICD-10-CM | POA: Diagnosis not present

## 2024-04-25 DIAGNOSIS — R7301 Impaired fasting glucose: Secondary | ICD-10-CM | POA: Diagnosis present

## 2024-04-25 DIAGNOSIS — E8809 Other disorders of plasma-protein metabolism, not elsewhere classified: Secondary | ICD-10-CM | POA: Diagnosis present

## 2024-04-25 DIAGNOSIS — K5720 Diverticulitis of large intestine with perforation and abscess without bleeding: Secondary | ICD-10-CM | POA: Diagnosis not present

## 2024-04-25 DIAGNOSIS — E781 Pure hyperglyceridemia: Secondary | ICD-10-CM | POA: Diagnosis present

## 2024-04-25 DIAGNOSIS — K5792 Diverticulitis of intestine, part unspecified, without perforation or abscess without bleeding: Secondary | ICD-10-CM | POA: Diagnosis not present

## 2024-04-25 DIAGNOSIS — Z981 Arthrodesis status: Secondary | ICD-10-CM

## 2024-04-25 DIAGNOSIS — D75839 Thrombocytosis, unspecified: Secondary | ICD-10-CM | POA: Diagnosis not present

## 2024-04-25 DIAGNOSIS — R000 Tachycardia, unspecified: Secondary | ICD-10-CM | POA: Diagnosis not present

## 2024-04-25 DIAGNOSIS — Z791 Long term (current) use of non-steroidal anti-inflammatories (NSAID): Secondary | ICD-10-CM

## 2024-04-25 DIAGNOSIS — K4090 Unilateral inguinal hernia, without obstruction or gangrene, not specified as recurrent: Secondary | ICD-10-CM | POA: Diagnosis not present

## 2024-04-25 DIAGNOSIS — R109 Unspecified abdominal pain: Secondary | ICD-10-CM | POA: Diagnosis not present

## 2024-04-25 LAB — COMPREHENSIVE METABOLIC PANEL WITH GFR
ALT: 27 U/L (ref 0–44)
AST: 22 U/L (ref 15–41)
Albumin: 3.8 g/dL (ref 3.5–5.0)
Alkaline Phosphatase: 77 U/L (ref 38–126)
Anion gap: 12 (ref 5–15)
BUN: 9 mg/dL (ref 6–20)
CO2: 23 mmol/L (ref 22–32)
Calcium: 9.4 mg/dL (ref 8.9–10.3)
Chloride: 103 mmol/L (ref 98–111)
Creatinine, Ser: 1.02 mg/dL (ref 0.61–1.24)
GFR, Estimated: >60 mL/min (ref >60)
Glucose, Bld: 121 mg/dL — ABNORMAL HIGH (ref 70–99)
Potassium: 3.7 mmol/L (ref 3.5–5.1)
Sodium: 138 mmol/L (ref 135–145)
Total Bilirubin: 1 mg/dL (ref 0.0–1.2)
Total Protein: 8 g/dL (ref 6.5–8.1)

## 2024-04-25 LAB — CBC
HCT: 42.3 % (ref 39.0–52.0)
Hemoglobin: 14.9 g/dL (ref 13.0–17.0)
MCH: 30.8 pg (ref 26.0–34.0)
MCHC: 35.2 g/dL (ref 30.0–36.0)
MCV: 87.4 fL (ref 80.0–100.0)
Platelets: 450 10*3/uL — ABNORMAL HIGH (ref 150–400)
RBC: 4.84 MIL/uL (ref 4.22–5.81)
RDW: 12.7 % (ref 11.5–15.5)
WBC: 14.3 10*3/uL — ABNORMAL HIGH (ref 4.0–10.5)
nRBC: 0 % (ref 0.0–0.2)

## 2024-04-25 LAB — I-STAT CG4 LACTIC ACID, ED: Lactic Acid, Venous: 1.5 mmol/L (ref 0.5–1.9)

## 2024-04-25 LAB — LIPASE, BLOOD: Lipase: 29 U/L (ref 11–51)

## 2024-04-25 MED ORDER — ACETAMINOPHEN 500 MG PO TABS
1000.0000 mg | ORAL_TABLET | Freq: Once | ORAL | Status: DC
  Filled 2024-04-25: qty 2

## 2024-04-25 MED ORDER — CIPROFLOXACIN IN D5W 400 MG/200ML IV SOLN
400.0000 mg | Freq: Once | INTRAVENOUS | Status: AC
  Administered 2024-04-25: 400 mg via INTRAVENOUS
  Filled 2024-04-25: qty 200

## 2024-04-25 MED ORDER — METRONIDAZOLE 500 MG/100ML IV SOLN
500.0000 mg | Freq: Two times a day (BID) | INTRAVENOUS | Status: DC
  Administered 2024-04-25: 500 mg via INTRAVENOUS
  Filled 2024-04-25: qty 100

## 2024-04-25 MED ORDER — SODIUM CHLORIDE 0.9 % IV BOLUS
1000.0000 mL | Freq: Once | INTRAVENOUS | Status: AC
  Administered 2024-04-25: 1000 mL via INTRAVENOUS

## 2024-04-25 MED ORDER — ONDANSETRON 4 MG PO TBDP
4.0000 mg | ORAL_TABLET | Freq: Once | ORAL | Status: DC
  Filled 2024-04-25: qty 1

## 2024-04-25 MED ORDER — MORPHINE SULFATE (PF) 4 MG/ML IV SOLN
4.0000 mg | Freq: Once | INTRAVENOUS | Status: AC
  Administered 2024-04-25: 4 mg via INTRAVENOUS
  Filled 2024-04-25: qty 1

## 2024-04-25 MED ORDER — ONDANSETRON HCL 4 MG/2ML IJ SOLN
4.0000 mg | Freq: Once | INTRAMUSCULAR | Status: AC
Start: 2024-04-25 — End: 2024-04-25
  Administered 2024-04-25: 4 mg via INTRAVENOUS
  Filled 2024-04-25: qty 2

## 2024-04-25 NOTE — ED Triage Notes (Signed)
 Patient c/o abdominal pain x 5 days. Patient report patient got diagnose with diverticulitis and got discharge with P.O. Antibiotics. Patient report taking PRN pain medication without relief. Patient report nausea/ vomiting x 1.

## 2024-04-25 NOTE — ED Provider Notes (Signed)
 [place] EMERGENCY DEPARTMENT AT [facility] Provider Note   CSN: [id number] Arrival date & time: 04/25/24  2227     History  Chief Complaint  Patient presents with   Abdominal Pain    Ryan Vargas is a 48 y.o. male.  HPI     This is a 48 year old male who presents with ongoing abdominal pain and nausea.  Patient was seen on Friday and diagnosed with diverticulitis.  He has been taking his antibiotics as directed.  States that he has not felt any better.  Reports he has had several episodes of emesis.  He also reports he has difficulty having a bowel movement.  When he has a bowel movement it is soft and he has noted some blood in his bowel movements.  He has felt febrile at home but has not taken his temperature.  Home Medications Prior to Admission medications   Medication Sig Start Date End Date Taking? Authorizing Provider  amoxicillin -clavulanate (AUGMENTIN ) 875-125 MG tablet Take 1 tablet by mouth every 12 (twelve) hours. 04/19/24   Guadalupe Lee, MD  cyclobenzaprine  (FLEXERIL ) 10 MG tablet One half to one tab PO qHS, then increase gradually to one tab TID. Patient not taking: Reported on 04/06/2024 02/24/24   Gean Keels, MD  docusate sodium  (COLACE) 100 MG capsule Take 1 capsule (100 mg total) by mouth 3 (three) times daily as needed. 02/24/24   Gean Keels, MD  hydrocortisone  (ANUSOL -HC) 25 MG suppository Place 1 suppository (25 mg total) rectally 2 (two) times daily as needed for hemorrhoids. 02/24/24   Gean Keels, MD  icosapent Ethyl (VASCEPA) 1 g capsule Take 2 capsules (2 g total) by mouth 2 (two) times daily. 08/27/22   Cydney Draft, MD  meloxicam  (MOBIC ) 15 MG tablet One tab PO every 24 hours with a meal for 2 weeks, then once every 24 hours prn pain. 04/06/24   Gean Keels, MD  ondansetron  (ZOFRAN -ODT) 8 MG disintegrating tablet Take 1 tablet (8 mg total) by mouth every 8 (eight) hours as needed for nausea  or vomiting. 04/19/24   Guadalupe Lee, MD  oxyCODONE -acetaminophen  (PERCOCET/ROXICET) 5-325 MG tablet Take 1-2 tablets by mouth every 6 (six) hours as needed for severe pain (pain score 7-10). 04/19/24   Guadalupe Lee, MD  witch hazel-glycerin  (TUCKS) pad Apply 1 Application topically as needed. 02/24/24   Gean Keels, MD      Allergies    Pollen extract    Review of Systems   Review of Systems  Constitutional:  Positive for chills.  Respiratory:  Negative for shortness of breath.   Cardiovascular:  Negative for chest pain.  Gastrointestinal:  Positive for abdominal pain, blood in stool, nausea and vomiting.  All other systems reviewed and are negative.   Physical Exam Updated Vital Signs BP (!) 140/92   Pulse (!) 109   Temp 99 F (37.2 C)   Resp 16   Ht 1.88 m (6\' 2" )   Wt 115.7 kg   SpO2 100%   BMI 32.75 kg/m  Physical Exam Vitals and nursing note reviewed.  Constitutional:      Appearance: He is well-developed.  HENT:     Head: Normocephalic and atraumatic.  Eyes:     Pupils: Pupils are equal, round, and reactive to light.  Cardiovascular:     Rate and Rhythm: Normal rate and regular rhythm.     Heart sounds: Normal heart sounds. No murmur heard. Pulmonary:  Effort: Pulmonary effort is normal. No respiratory distress.     Breath sounds: Normal breath sounds. No wheezing.  Abdominal:     General: Bowel sounds are normal.     Palpations: Abdomen is soft.     Tenderness: There is abdominal tenderness in the suprapubic area and left lower quadrant. There is no guarding or rebound.  Musculoskeletal:     Cervical back: Neck supple.  Lymphadenopathy:     Cervical: No cervical adenopathy.  Skin:    General: Skin is warm and dry.  Neurological:     Mental Status: He is alert and oriented to person, place, and time.  Psychiatric:        Mood and Affect: Mood normal.     ED Results / Procedures / Treatments   Labs (all labs ordered are listed, but only  abnormal results are displayed) Labs Reviewed  CBC - Abnormal; Notable for the following components:      Result Value   WBC 14.3 (*)    Platelets 450 (*)    All other components within normal limits  LIPASE, BLOOD  COMPREHENSIVE METABOLIC PANEL WITH GFR  URINALYSIS, ROUTINE W REFLEX MICROSCOPIC  I-STAT CG4 LACTIC ACID, ED    EKG None  Radiology CT ABDOMEN PELVIS W CONTRAST Result Date: 04/26/2024 EXAM: CT ABDOMEN AND PELVIS WITH CONTRAST 04/26/2024 01:15:10 AM TECHNIQUE: CT of the abdomen and pelvis was performed with the administration of intravenous contrast. Multiplanar reformatted images are provided for review. Automated exposure control, iterative reconstruction, and/or weight based adjustment of the mA/kV was utilized to reduce the radiation dose to as low as reasonably achievable. COMPARISON: 04/19/2024 CLINICAL HISTORY: LLQ abdominal pain; suspect complicated diverticulitis. Patient c/o abdominal pain x 5 days. Patient report patient got diagnose with diverticulitis and got discharge with P.O. Antibiotics. Patient report taking PRN pain medication without relief. Patient report nausea/ vomiting x 1. FINDINGS: LOWER CHEST: Mild bibasilar atelectasis. HEPATOBILIARY: The liver is unremarkable. Gallbladder is unremarkable. No biliary ductal dilatation. SPLEEN: No acute abnormality. PANCREAS: No acute abnormality. ADRENAL GLANDS: No acute abnormality. KIDNEYS, URETERS AND BLADDER: No stones in the kidneys or ureters. No evidence of hydronephrosis. No evidence of perinephric or periureteral stranding. Urinary bladder is unremarkable. GI AND BOWEL: Stomach demonstrates no acute abnormality. There is no evidence of bowel obstruction. Acute sigmoid diverticulitis, mildly progressive, with mesenteric gas and ill-defined fluid measuring up to 2.6 cm, raising concern for early developing pericolonic abscess. Normal appendix. PERITONEUM AND RETROPERITONEUM: No free air. Aorta is normal in caliber.  LYMPH NODES: No evidence of lymphadenopathy. REPRODUCTIVE ORGANS: No acute abnormality. BONES AND SOFT TISSUES: No acute osseous abnormality. Tiny fat-containing right inguinal hernia. IMPRESSION: 1. Acute sigmoid diverticulitis, mildly progressive, with early developing pericolonic abscess. No free air. Electronically signed by: Zadie Herter MD 04/26/2024 01:21 AM EDT RP Workstation: [id number]    Procedures Procedures    Medications Ordered in ED Medications  acetaminophen  (TYLENOL ) tablet 1,000 mg (has no administration in time range)  ondansetron  (ZOFRAN -ODT) disintegrating tablet 4 mg (has no administration in time range)  ciprofloxacin  (CIPRO ) IVPB 400 mg (has no administration in time range)  metroNIDAZOLE  (FLAGYL ) IVPB 500 mg (has no administration in time range)  sodium chloride  0.9 % bolus 1,000 mL (has no administration in time range)  morphine  (PF) 4 MG/ML injection 4 mg (has no administration in time range)  ondansetron  (ZOFRAN ) injection 4 mg (has no administration in time range)    ED Course/ Medical Decision Making/ A&P  Medical Decision Making Amount and/or Complexity of Data Reviewed Labs: ordered.  Risk Prescription drug management. Decision regarding hospitalization.   This patient presents to the ED for concern of ongoing abdominal pain, this involves an extensive number of treatment options, and is a complaint that carries with it a high risk of complications and morbidity.  I considered the following differential and admission for this acute, potentially life threatening condition.  The differential diagnosis includes persistent diverticulitis, complication including abscess or perforation, SBO  MDM:    This is a 48 year old male who presents with ongoing persistent symptoms.  Was treated with Augmentin  for diverticulitis outpatient.  CT imaging reviewed from prior visit and showed uncomplicated disease.  He is tender on  exam.  Patient given pain and nausea medication.  He has a persistent leukocytosis to 14.  For this reason he was reimaged.  He was given IV Flagyl  and Cipro .  Repeat CT imaging is concerning for early abscess.  For this reason we will plan for admission to the hospital.  Discussed with hospitalist.  Will hold off on surgical evaluation or consult till morning as that would not likely change plan at this time.  (Labs, imaging, consults)  Labs: I Ordered, and personally interpreted labs.  The pertinent results include: CBC, CMP, urinalysis, lipase  Imaging Studies ordered: I ordered imaging studies including CT I independently visualized and interpreted imaging. I agree with the radiologist interpretation  Additional history obtained from chart review.  External records from outside source obtained and reviewed including prior evaluations and imaging  Cardiac Monitoring: The patient was maintained on a cardiac monitor.  If on the cardiac monitor, I personally viewed and interpreted the cardiac monitored which showed an underlying rhythm of: Sinus  Reevaluation: After the interventions noted above, I reevaluated the patient and found that they have :improved  Social Determinants of Health:  lives independently  Disposition: Discharge  Co morbidities that complicate the patient evaluation  Past Medical History:  Diagnosis Date   Arthritis 03/2012   right subtalar navicular arthritis     Medicines Meds ordered this encounter  Medications   acetaminophen  (TYLENOL ) tablet 1,000 mg   ondansetron  (ZOFRAN -ODT) disintegrating tablet 4 mg   ciprofloxacin  (CIPRO ) IVPB 400 mg    Antibiotic Indication::   Intra-abdominal Infection   DISCONTD: metroNIDAZOLE  (FLAGYL ) IVPB 500 mg    Antibiotic Indication::   Intra-abdominal Infection   sodium chloride  0.9 % bolus 1,000 mL   morphine  (PF) 4 MG/ML injection 4 mg   ondansetron  (ZOFRAN ) injection 4 mg   iohexol  (OMNIPAQUE ) 300 MG/ML solution  100 mL   OR Linked Order Group    acetaminophen  (TYLENOL ) tablet 650 mg    acetaminophen  (TYLENOL ) suppository 650 mg   ondansetron  (ZOFRAN ) injection 4 mg   naloxone  (NARCAN ) injection 0.4 mg   HYDROmorphone  (DILAUDID ) injection 0.5 mg   lactated ringers  infusion   DISCONTD: ciprofloxacin  (CIPRO ) IVPB 400 mg    Antibiotic Indication::   Intra-abdominal Infection   DISCONTD: piperacillin -tazobactam (ZOSYN ) IVPB 3.375 g    Antibiotic Indication::   Intra-abdominal Infection   piperacillin -tazobactam (ZOSYN ) IVPB 3.375 g    Antibiotic Indication::   Intra-abdominal Infection   oxyCODONE  (Oxy IR/ROXICODONE ) immediate release tablet 5 mg    Refill:  0    I have reviewed the patients home medicines and have made adjustments as needed  Problem List / ED Course: Problem List Items Addressed This Visit   None Visit Diagnoses       Diverticulitis of  large intestine with abscess, unspecified bleeding status    -  Primary   Relevant Medications   ciprofloxacin  (CIPRO ) IVPB 400 mg (Completed)   morphine  (PF) 4 MG/ML injection 4 mg (Completed)   HYDROmorphone  (DILAUDID ) injection 0.5 mg   piperacillin -tazobactam (ZOSYN ) IVPB 3.375 g   oxyCODONE  (Oxy IR/ROXICODONE ) immediate release tablet 5 mg                   Final Clinical Impression(s) / ED Diagnoses Final diagnoses:  Diverticulitis of large intestine with abscess, unspecified bleeding status    Rx / DC Orders ED Discharge Orders     None         Rory Collard, MD 04/27/24 [phone]

## 2024-04-25 NOTE — ED Provider Triage Note (Signed)
 Emergency Medicine Provider Triage Evaluation Note  TEODORO LOTA , a 48 y.o. male  was evaluated in triage.  Pt complains of abd pain. LLQ pain persisted after being treated for diverticulitis a week ago with augmentin .  Now having worsening pain nausea, fever.  Concerns for perf/abscess  Review of Systems  Positive: As above Negative: As above  Physical Exam  BP (!) 140/92   Pulse (!) 109   Temp 99 F (37.2 C)   Resp 16   Ht 6\' 2"  (1.88 m)   Wt 115.7 kg   SpO2 100%   BMI 32.75 kg/m  Gen:   Awake, no distress   Resp:  Normal effort  MSK:   Moves extremities without difficulty  Other:    Medical Decision Making  Medically screening exam initiated at 10:52 PM.  Appropriate orders placed.  DURL WALTHALL was informed that the remainder of the evaluation will be completed by another provider, this initial triage assessment does not replace that evaluation, and the importance of remaining in the ED until their evaluation is complete.     Debbra Fairy, PA-C 04/25/24 [phone]

## 2024-04-26 ENCOUNTER — Emergency Department (HOSPITAL_COMMUNITY)

## 2024-04-26 ENCOUNTER — Encounter (HOSPITAL_COMMUNITY): Payer: Self-pay | Admitting: Internal Medicine

## 2024-04-26 DIAGNOSIS — E66811 Obesity, class 1: Secondary | ICD-10-CM | POA: Diagnosis present

## 2024-04-26 DIAGNOSIS — E8809 Other disorders of plasma-protein metabolism, not elsewhere classified: Secondary | ICD-10-CM | POA: Insufficient documentation

## 2024-04-26 DIAGNOSIS — K5792 Diverticulitis of intestine, part unspecified, without perforation or abscess without bleeding: Secondary | ICD-10-CM | POA: Diagnosis not present

## 2024-04-26 DIAGNOSIS — Z791 Long term (current) use of non-steroidal anti-inflammatories (NSAID): Secondary | ICD-10-CM | POA: Diagnosis not present

## 2024-04-26 DIAGNOSIS — R1032 Left lower quadrant pain: Secondary | ICD-10-CM | POA: Diagnosis not present

## 2024-04-26 DIAGNOSIS — E781 Pure hyperglyceridemia: Secondary | ICD-10-CM | POA: Diagnosis present

## 2024-04-26 DIAGNOSIS — Z833 Family history of diabetes mellitus: Secondary | ICD-10-CM | POA: Diagnosis not present

## 2024-04-26 DIAGNOSIS — K5732 Diverticulitis of large intestine without perforation or abscess without bleeding: Secondary | ICD-10-CM | POA: Diagnosis not present

## 2024-04-26 DIAGNOSIS — Z8249 Family history of ischemic heart disease and other diseases of the circulatory system: Secondary | ICD-10-CM | POA: Diagnosis not present

## 2024-04-26 DIAGNOSIS — R109 Unspecified abdominal pain: Secondary | ICD-10-CM | POA: Diagnosis present

## 2024-04-26 DIAGNOSIS — K4090 Unilateral inguinal hernia, without obstruction or gangrene, not specified as recurrent: Secondary | ICD-10-CM | POA: Diagnosis not present

## 2024-04-26 DIAGNOSIS — K5720 Diverticulitis of large intestine with perforation and abscess without bleeding: Secondary | ICD-10-CM | POA: Diagnosis present

## 2024-04-26 DIAGNOSIS — Z6830 Body mass index (BMI) 30.0-30.9, adult: Secondary | ICD-10-CM | POA: Diagnosis not present

## 2024-04-26 DIAGNOSIS — Z981 Arthrodesis status: Secondary | ICD-10-CM | POA: Diagnosis not present

## 2024-04-26 DIAGNOSIS — D75839 Thrombocytosis, unspecified: Secondary | ICD-10-CM | POA: Diagnosis present

## 2024-04-26 DIAGNOSIS — H5462 Unqualified visual loss, left eye, normal vision right eye: Secondary | ICD-10-CM | POA: Diagnosis present

## 2024-04-26 DIAGNOSIS — R7301 Impaired fasting glucose: Secondary | ICD-10-CM | POA: Diagnosis present

## 2024-04-26 LAB — COMPREHENSIVE METABOLIC PANEL WITH GFR
ALT: 22 U/L (ref 0–44)
AST: 16 U/L (ref 15–41)
Albumin: 3.2 g/dL — ABNORMAL LOW (ref 3.5–5.0)
Alkaline Phosphatase: 65 U/L (ref 38–126)
Anion gap: 10 (ref 5–15)
BUN: 8 mg/dL (ref 6–20)
CO2: 24 mmol/L (ref 22–32)
Calcium: 8.6 mg/dL — ABNORMAL LOW (ref 8.9–10.3)
Chloride: 103 mmol/L (ref 98–111)
Creatinine, Ser: 0.77 mg/dL (ref 0.61–1.24)
GFR, Estimated: >60 mL/min (ref >60)
Glucose, Bld: 103 mg/dL — ABNORMAL HIGH (ref 70–99)
Potassium: 3.6 mmol/L (ref 3.5–5.1)
Sodium: 137 mmol/L (ref 135–145)
Total Bilirubin: 0.9 mg/dL (ref 0.0–1.2)
Total Protein: 7.1 g/dL (ref 6.5–8.1)

## 2024-04-26 LAB — URINALYSIS, ROUTINE W REFLEX MICROSCOPIC
Bilirubin Urine: NEGATIVE
Glucose, UA: NEGATIVE mg/dL
Hgb urine dipstick: NEGATIVE
Ketones, ur: NEGATIVE mg/dL
Leukocytes,Ua: NEGATIVE
Nitrite: NEGATIVE
Protein, ur: NEGATIVE mg/dL
Specific Gravity, Urine: 1.01 (ref 1.005–1.030)
pH: 6 (ref 5.0–8.0)

## 2024-04-26 LAB — CBC WITH DIFFERENTIAL/PLATELET
Abs Immature Granulocytes: 0.06 10*3/uL (ref 0.00–0.07)
Basophils Absolute: 0 10*3/uL (ref 0.0–0.1)
Basophils Relative: 0 %
Eosinophils Absolute: 0.2 10*3/uL (ref 0.0–0.5)
Eosinophils Relative: 1 %
HCT: 40.2 % (ref 39.0–52.0)
Hemoglobin: 13.7 g/dL (ref 13.0–17.0)
Immature Granulocytes: 1 %
Lymphocytes Relative: 14 %
Lymphs Abs: 1.8 10*3/uL (ref 0.7–4.0)
MCH: 30.3 pg (ref 26.0–34.0)
MCHC: 34.1 g/dL (ref 30.0–36.0)
MCV: 88.9 fL (ref 80.0–100.0)
Monocytes Absolute: 1 10*3/uL (ref 0.1–1.0)
Monocytes Relative: 8 %
Neutro Abs: 9.8 10*3/uL — ABNORMAL HIGH (ref 1.7–7.7)
Neutrophils Relative %: 76 %
Platelets: 422 10*3/uL — ABNORMAL HIGH (ref 150–400)
RBC: 4.52 MIL/uL (ref 4.22–5.81)
RDW: 12.8 % (ref 11.5–15.5)
WBC: 12.8 10*3/uL — ABNORMAL HIGH (ref 4.0–10.5)
nRBC: 0 % (ref 0.0–0.2)

## 2024-04-26 LAB — MAGNESIUM: Magnesium: 2 mg/dL (ref 1.7–2.4)

## 2024-04-26 LAB — PROTIME-INR
INR: 1.2 (ref 0.8–1.2)
Prothrombin Time: 15 s (ref 11.4–15.2)

## 2024-04-26 LAB — I-STAT CG4 LACTIC ACID, ED: Lactic Acid, Venous: 1.9 mmol/L (ref 0.5–1.9)

## 2024-04-26 MED ORDER — ACETAMINOPHEN 325 MG PO TABS
650.0000 mg | ORAL_TABLET | Freq: Four times a day (QID) | ORAL | Status: DC | PRN
  Administered 2024-04-27: 650 mg via ORAL
  Filled 2024-04-26: qty 2

## 2024-04-26 MED ORDER — HYDROMORPHONE HCL 1 MG/ML IJ SOLN
0.5000 mg | INTRAMUSCULAR | Status: DC | PRN
  Administered 2024-04-26 – 2024-04-27 (×3): 0.5 mg via INTRAVENOUS
  Filled 2024-04-26: qty 1
  Filled 2024-04-26 (×2): qty 0.5

## 2024-04-26 MED ORDER — ONDANSETRON HCL 4 MG/2ML IJ SOLN
4.0000 mg | Freq: Four times a day (QID) | INTRAMUSCULAR | Status: DC | PRN
  Administered 2024-04-27: 4 mg via INTRAVENOUS
  Filled 2024-04-26: qty 2

## 2024-04-26 MED ORDER — LACTATED RINGERS IV SOLN: INTRAVENOUS | Status: AC

## 2024-04-26 MED ORDER — OXYCODONE HCL 5 MG PO TABS
5.0000 mg | ORAL_TABLET | ORAL | Status: DC | PRN
  Administered 2024-04-26 – 2024-04-30 (×9): 5 mg via ORAL
  Filled 2024-04-26 (×9): qty 1

## 2024-04-26 MED ORDER — ACETAMINOPHEN 650 MG RE SUPP: 650.0000 mg | Freq: Four times a day (QID) | RECTAL | Status: DC | PRN

## 2024-04-26 MED ORDER — CIPROFLOXACIN IN D5W 400 MG/200ML IV SOLN
400.0000 mg | Freq: Two times a day (BID) | INTRAVENOUS | Status: DC
  Administered 2024-04-26: 400 mg via INTRAVENOUS
  Filled 2024-04-26: qty 200

## 2024-04-26 MED ORDER — PIPERACILLIN-TAZOBACTAM 3.375 G IVPB
3.3750 g | Freq: Three times a day (TID) | INTRAVENOUS | Status: DC
  Administered 2024-04-26 – 2024-04-30 (×12): 3.375 g via INTRAVENOUS
  Filled 2024-04-26 (×12): qty 50

## 2024-04-26 MED ORDER — PIPERACILLIN-TAZOBACTAM 3.375 G IVPB 30 MIN: 3.3750 g | Freq: Three times a day (TID) | INTRAVENOUS | Status: DC

## 2024-04-26 MED ORDER — NALOXONE HCL 0.4 MG/ML IJ SOLN: 0.4000 mg | INTRAMUSCULAR | Status: DC | PRN

## 2024-04-26 MED ORDER — IOHEXOL 300 MG/ML  SOLN
100.0000 mL | Freq: Once | INTRAMUSCULAR | Status: AC | PRN
  Administered 2024-04-26: 100 mL via INTRAVENOUS

## 2024-04-26 NOTE — H&P (Signed)
 History and Physical    Patient: Ryan Vargas ZOX:[id number] DOB: 1976/06/27 DOA: 04/25/2024 DOS: the patient was seen and examined on 04/26/2024 PCP: Cydney Draft, MD  Patient coming from: Home  Chief Complaint:  Chief Complaint  Patient presents with   Abdominal Pain   HPI: Ryan Vargas is a 48 y.o. male with medical history significant of osteoarthritis, class I obesity, glucose intolerance, abnormal LFTs, left eye blindness, hypertriglyceridemia, diverticulosis, history of diverticulitis, history of lower GI bleed who was diagnosed with acute diverticulitis on 04/20/2024 and started on Augmentin .  However, his symptoms have worsened despite taking the Augmentin  as prescribed.  He started having nausea and emesis yesterday.  Felt febrile with positive chills and had sweats. No  diarrhea, constipation, melena or hematochezia.  No flank pain, dysuria, frequency or hematuria. He denied rhinorrhea, sore throat, wheezing or hemoptysis.  No chest pain, palpitations, diaphoresis, PND, orthopnea or pitting edema of the lower extremities.   No polyuria, polydipsia, polyphagia or blurred vision.   Lab work: Urinalysis was normal.  CBC showed white count 14.3, hemoglobin 14.9 g/dL and platelets 811.  Normal PT and INR.  Lactic acid was normal twice.  CMP with a glucose of 121 mg/dL, but all other values were otherwise within expected range.  Imaging: CT abdomen/pelvis done today show mildly progressive acute sigmoid diverticulitis with early developing pericolonic abscess.  No free air.  There was mild bibasilar atelectasis.  ED course: Initial vital signs were temperature 99 F, pulse 79, respirations 16, BP 140/92 mmHg and O2 sat 100% on room air.  The patient received ciprofloxacin  1000 mL normal saline bolus, 400 mg IVPB, metronidazole  500 mg IVPB, morphine  4 mg IVP and ondansetron  4 mg IVP.  Review of Systems: As mentioned in the history of present illness. All other systems reviewed and  are negative. Past Medical History:  Diagnosis Date   Arthritis 03/2012   right subtalar navicular arthritis   Past Surgical History:  Procedure Laterality Date   ANKLE FUSION  04/20/2012   Procedure: ARTHRODESIS ANKLE;  Surgeon: Amada Backer, MD;  Location: [facility];  Service: Orthopedics;  Laterality: Right;  RIGHT GASTROC RECESSION, SUBTALAR ARTHRODESIS, TALAR NAVICULAR ARTHRODESIS   Social History:  reports that he has never smoked. He has never used smokeless tobacco. He reports current drug use. He reports that he does not drink alcohol.  Allergies  Allergen Reactions   Pollen Extract     Family History  Problem Relation Age of Onset   Diabetes Father    Hypertension Father     Prior to Admission medications   Medication Sig Start Date End Date Taking? Authorizing Provider  amoxicillin -clavulanate (AUGMENTIN ) 875-125 MG tablet Take 1 tablet by mouth every 12 (twelve) hours. 04/19/24  Yes Steinl, Kevin, MD  ibuprofen (ADVIL) 800 MG tablet Take 800 mg by mouth every 6 (six) hours as needed. 12/29/23  Yes [provider]  meloxicam  (MOBIC ) 15 MG tablet One tab PO every 24 hours with a meal for 2 weeks, then once every 24 hours prn pain. 04/06/24  Yes Gean Keels, MD  ondansetron  (ZOFRAN -ODT) 8 MG disintegrating tablet Take 1 tablet (8 mg total) by mouth every 8 (eight) hours as needed for nausea or vomiting. 04/19/24  Yes Steinl, Kevin, MD  oxyCODONE -acetaminophen  (PERCOCET/ROXICET) 5-325 MG tablet Take 1-2 tablets by mouth every 6 (six) hours as needed for severe pain (pain score 7-10). 04/19/24  Yes Guadalupe Lee, MD  cyclobenzaprine  (FLEXERIL ) 10 MG tablet  One half to one tab PO qHS, then increase gradually to one tab TID. Patient not taking: Reported on 04/06/2024 02/24/24   Gean Keels, MD  docusate sodium  (COLACE) 100 MG capsule Take 1 capsule (100 mg total) by mouth 3 (three) times daily as needed. Patient not taking: Reported on  04/26/2024 02/24/24   Gean Keels, MD  hydrocortisone  (ANUSOL -HC) 25 MG suppository Place 1 suppository (25 mg total) rectally 2 (two) times daily as needed for hemorrhoids. Patient not taking: Reported on 04/26/2024 02/24/24   Gean Keels, MD  icosapent Ethyl (VASCEPA) 1 g capsule Take 2 capsules (2 g total) by mouth 2 (two) times daily. Patient not taking: Reported on 04/26/2024 08/27/22   Cydney Draft, MD  witch hazel-glycerin  (TUCKS) pad Apply 1 Application topically as needed. Patient not taking: Reported on 04/26/2024 02/24/24   Gean Keels, MD    Physical Exam: Vitals:   04/26/24 1610 04/26/24 0146 04/26/24 0527 04/26/24 0608  BP:  130/73  137/88  Pulse: 81 78  72  Resp: 20 15  15   Temp:  98.6 F (37 C) 98.5 F (36.9 C) 98.5 F (36.9 C)  TempSrc:  Oral Oral Oral  SpO2: 99% 99%  98%  Weight:      Height:       Physical Exam Vitals and nursing note reviewed.  Constitutional:      General: He is awake. He is not in acute distress.    Appearance: He is well-developed. He is ill-appearing.  HENT:     Head: Normocephalic.     Nose: No rhinorrhea.     Mouth/Throat:     Mouth: Mucous membranes are moist.  Eyes:     General: No scleral icterus.    Pupils: Pupils are equal, round, and reactive to light.  Neck:     Vascular: No JVD.  Cardiovascular:     Rate and Rhythm: Normal rate and regular rhythm.     Heart sounds: S1 normal and S2 normal.  Pulmonary:     Effort: No tachypnea.     Breath sounds: No wheezing, rhonchi or rales.  Abdominal:     General: Bowel sounds are normal. There is no distension.     Palpations: Abdomen is soft.     Tenderness: There is abdominal tenderness. There is no right CVA tenderness or guarding.  Musculoskeletal:     Cervical back: Neck supple.     Right lower leg: No edema.     Left lower leg: No edema.  Skin:    General: Skin is warm and dry.  Neurological:     General: No focal deficit present.      Mental Status: He is alert and oriented to person, place, and time.  Psychiatric:        Mood and Affect: Mood normal.        Behavior: Behavior normal. Behavior is cooperative.     Data Reviewed:  Results are pending, will review when available.  EKG: Vent. rate 102 BPM PR interval 144 ms QRS duration 110 ms QT/QTcB 342/446 ms P-R-T axes 65 177 11 Sinus tachycardia Low voltage, precordial leads Probable RVH w/ secondary repol abnormality  Assessment and Plan: Principal Problem:   Acute diverticulitis Admit to PCU/inpatient. Analgesics as needed. Antiemetics as needed. May have clear liquids. Begin Zosyn  3.375 g IVPB every 8 hours. Follow CBC and CMP in a.m. General Surgery consult appreciated.  Active Problems:   Hypertriglyceridemia, familial Follow-up with primary care  provider.    IFG (impaired fasting glucose) Check fasting glucose. Check hemoglobin A1c.    Class 1 obesity Current BMI 30.16 kg/m. Would benefit from lifestyle modifications. Follow-up closely with PCP and/or bariatric clinic.    Hypoalbuminemia In the setting of acute infection. May benefit from protein supplementation. Follow-up albumin level.    Thrombocytosis In the setting of acute illness. Follow-up platelet count.    Advance Care Planning:   Code Status: Full Code   Consults: [facility] surgery.  Family Communication:   Severity of Illness: The appropriate patient status for this patient is INPATIENT. Inpatient status is judged to be reasonable and necessary in order to provide the required intensity of service to ensure the patient's safety. The patient's presenting symptoms, physical exam findings, and initial radiographic and laboratory data in the context of their chronic comorbidities is felt to place them at high risk for further clinical deterioration. Furthermore, it is not anticipated that the patient will be medically stable for discharge from the hospital within  2 midnights of admission.   * I certify that at the point of admission it is my clinical judgment that the patient will require inpatient hospital care spanning beyond 2 midnights from the point of admission due to high intensity of service, high risk for further deterioration and high frequency of surveillance required.*  Author: Danice Dural, MD 04/26/2024 7:41 AM  For on call review [email or web address].   This document was prepared using Dragon voice recognition software and may contain some unintended transcription errors.

## 2024-04-26 NOTE — ED Notes (Signed)
 Patient is resting comfortably.

## 2024-04-26 NOTE — Progress Notes (Signed)
  Carryover admission to the Day Admitter.  I discussed this case with the EDP, Dr. Donita Furrow.  Per these discussions:   This is a 48 year old male who is being admitted with acute diverticulitis, refractory to outpatient oral antibiotics.  He was diagnosed as an outpatient with acute diverticulitis via CT scan on Friday, 04/20/2024 and started on Augmentin .  However, in spite of good interval compliance on Augmentin , he has noted further worsening of his abdominal discomfort associated with chills and intermittent nausea.  In the ED today, he is noted to be afebrile.   CBC reflects white blood cell count of 14,000, which is reported to be stable relative to most recent prior open cell count.  Lactic acid 1.5.  Repeat CT abdomen/pelvis shows interval progression in his acute diverticulitis without evidence of perforation.   He was started on IV Cipro  and Flagyl  in the ED this evening.  I have placed an order for the patient admission to med/tele for further evaluation management of the above.  I have placed some additional preliminary admit orders via the adult multi-morbid admission order set. I have also ordered n.p.o., prn IV Dilaudid , prn IV Zofran , continuous lactated Ringer 's at 100 cc/h x 12 hours.  I have continued the IV Cipro  and Flagyl  that was started in the ED this evening and ordered morning labs in the form of CMP, CBC, magnesium level as well as INR.    Camelia Cavalier, DO Hospitalist

## 2024-04-26 NOTE — ED Notes (Signed)
Denies chest pain/sob at this time -

## 2024-04-26 NOTE — Consult Note (Signed)
 Ryan Vargas November 05, 1976  [id number].    Requesting MD: Dr. Lucienne Ryder Chief Complaint/Reason for Consult: diverticulitis with phlegmon  HPI:  This is a 48 year old black male with no significant past medical history who began having left lower quadrant abdominal pain last Wednesday.  This continued to persist and got worse.  He presented to the emergency department on April 25.  He underwent a CT scan that revealed diverticulitis.  He was able to be discharged home with oral Augmentin .  Unfortunately his pain has continued to persist and worsen with no improvement in his symptoms.  He did have some nausea and vomiting yesterday.  He has continued to have bowel movements.  He denies any blood in his stool.  He admits to having sweats and feeling like he has a fever but has not checked it.  Upon return to the emergency department yesterday he was found to have a CT scan showing persistent diverticulitis and now with a phlegmon.  No definitive abscess.  His white blood cell count was 14,000 on admission and has improved to 12,000 today.  He has been placed on IV Zosyn .  His pain has gotten much better from yesterday to today already.  We have been asked to evaluate the patient for further recommendations.  Of note, he did have a colonoscopy last year with several polyps that revealed some hyperplasia but no other findings.  This has been uploaded into the procedure section of epic.  ROS: ROS: See HPI  Family History  Problem Relation Age of Onset   Diabetes Father    Hypertension Father     Past Medical History:  Diagnosis Date   Arthritis 03/2012   right subtalar navicular arthritis    Past Surgical History:  Procedure Laterality Date   ANKLE FUSION  04/20/2012   Procedure: ARTHRODESIS ANKLE;  Surgeon: Amada Backer, MD;  Location: [facility];  Service: Orthopedics;  Laterality: Right;  RIGHT GASTROC RECESSION, SUBTALAR ARTHRODESIS, TALAR NAVICULAR ARTHRODESIS     Social History:  reports that he has never smoked. He has never used smokeless tobacco. He reports current drug use. He reports that he does not drink alcohol.  Allergies:  Allergies  Allergen Reactions   Pollen Extract     Medications Prior to Admission  Medication Sig Dispense Refill   amoxicillin -clavulanate (AUGMENTIN ) 875-125 MG tablet Take 1 tablet by mouth every 12 (twelve) hours. 20 tablet 0   ibuprofen (ADVIL) 800 MG tablet Take 800 mg by mouth every 6 (six) hours as needed.     meloxicam  (MOBIC ) 15 MG tablet One tab PO every 24 hours with a meal for 2 weeks, then once every 24 hours prn pain. 30 tablet 3   ondansetron  (ZOFRAN -ODT) 8 MG disintegrating tablet Take 1 tablet (8 mg total) by mouth every 8 (eight) hours as needed for nausea or vomiting. 10 tablet 0   oxyCODONE -acetaminophen  (PERCOCET/ROXICET) 5-325 MG tablet Take 1-2 tablets by mouth every 6 (six) hours as needed for severe pain (pain score 7-10). 10 tablet 0   cyclobenzaprine  (FLEXERIL ) 10 MG tablet One half to one tab PO qHS, then increase gradually to one tab TID. (Patient not taking: Reported on 04/06/2024) 30 tablet 0   docusate sodium  (COLACE) 100 MG capsule Take 1 capsule (100 mg total) by mouth 3 (three) times daily as needed. (Patient not taking: Reported on 04/26/2024) 90 capsule 3   hydrocortisone  (ANUSOL -HC) 25 MG suppository Place 1 suppository (25 mg total) rectally 2 (  two) times daily as needed for hemorrhoids. (Patient not taking: Reported on 04/26/2024) 24 suppository 11   icosapent Ethyl (VASCEPA) 1 g capsule Take 2 capsules (2 g total) by mouth 2 (two) times daily. (Patient not taking: Reported on 04/26/2024) 360 capsule 3   witch hazel-glycerin  (TUCKS) pad Apply 1 Application topically as needed. (Patient not taking: Reported on 04/26/2024) 1 each 11     Physical Exam: Blood pressure 129/81, pulse 71, temperature 98.1 F (36.7 C), resp. rate 20, height 6\' 4"  (1.93 m), weight 112.4 kg, SpO2 99%. General:  pleasant, WD, WN black male who is laying in bed in NAD HEENT: head is normocephalic, atraumatic.  Sclera are noninjected.  PERRL.  Ears and nose without any masses or lesions.  Mouth is pink and moist Heart: regular, rate, and rhythm.  Normal s1,s2. No obvious murmurs, gallops, or rubs noted.   Lungs: CTAB, no wheezes, rhonchi, or rales noted.  Respiratory effort nonlabored Abd: soft, mildly tender in the left lower quadrant with no guarding or rebound, ND, +BS, no masses, hernias, or organomegaly Psych: A&Ox3 with an appropriate affect.   Results for orders placed or performed during the hospital encounter of 04/25/24 (from the past 48 hours)  Lipase, blood     Status: None   Collection Time: 04/25/24 10:46 PM  Result Value Ref Range   Lipase 29 11 - 51 U/L    Comment: Performed at [facility], [address] [place], [place] [address]  Comprehensive metabolic panel     Status: Abnormal   Collection Time: 04/25/24 10:46 PM  Result Value Ref Range   Sodium 138 135 - 145 mmol/L   Potassium 3.7 3.5 - 5.1 mmol/L   Chloride 103 98 - 111 mmol/L   CO2 23 22 - 32 mmol/L   Glucose, Bld 121 (H) 70 - 99 mg/dL    Comment: Glucose reference range applies only to samples taken after fasting for at least 8 hours.   BUN 9 6 - 20 mg/dL   Creatinine, Ser 8.41 0.61 - 1.24 mg/dL   Calcium  9.4 8.9 - 10.3 mg/dL   Total Protein 8.0 6.5 - 8.1 g/dL   Albumin 3.8 3.5 - 5.0 g/dL   AST 22 15 - 41 U/L   ALT 27 0 - 44 U/L   Alkaline Phosphatase 77 38 - 126 U/L   Total Bilirubin 1.0 0.0 - 1.2 mg/dL   GFR, Estimated >32 >44 mL/min    Comment: (NOTE) Calculated using the CKD-EPI Creatinine Equation (2021)    Anion gap 12 5 - 15    Comment: Performed at [facility], [address] [place], [place] [address]  CBC     Status: Abnormal   Collection Time: 04/25/24 10:46 PM  Result Value Ref Range   WBC 14.3 (H) 4.0 - 10.5 K/uL   RBC 4.84 4.22 - 5.81 MIL/uL    Hemoglobin 14.9 13.0 - 17.0 g/dL   HCT 25.3 66.4 - 40.3 %   MCV 87.4 80.0 - 100.0 fL   MCH 30.8 26.0 - 34.0 pg   MCHC 35.2 30.0 - 36.0 g/dL   RDW 47.4 25.9 - 56.3 %   Platelets 450 (H) 150 - 400 K/uL   nRBC 0.0 0.0 - 0.2 %    Comment: Performed at [facility], [address], [place], [place] [address]  I-Stat CG4 Lactic Acid     Status: None   Collection Time: 04/25/24 11:06 PM  Result  Value Ref Range   Lactic Acid, Venous 1.5 0.5 - 1.9 mmol/L  I-Stat CG4 Lactic Acid, ED     Status: None   Collection Time: 04/26/24 12:05 AM  Result Value Ref Range   Lactic Acid, Venous 1.9 0.5 - 1.9 mmol/L  CBC with Differential/Platelet     Status: Abnormal   Collection Time: 04/26/24  6:15 AM  Result Value Ref Range   WBC 12.8 (H) 4.0 - 10.5 K/uL   RBC 4.52 4.22 - 5.81 MIL/uL   Hemoglobin 13.7 13.0 - 17.0 g/dL   HCT 60.4 54.0 - 98.1 %   MCV 88.9 80.0 - 100.0 fL   MCH 30.3 26.0 - 34.0 pg   MCHC 34.1 30.0 - 36.0 g/dL   RDW 19.1 47.8 - 29.5 %   Platelets 422 (H) 150 - 400 K/uL   nRBC 0.0 0.0 - 0.2 %   Neutrophils Relative % 76 %   Neutro Abs 9.8 (H) 1.7 - 7.7 K/uL   Lymphocytes Relative 14 %   Lymphs Abs 1.8 0.7 - 4.0 K/uL   Monocytes Relative 8 %   Monocytes Absolute 1.0 0.1 - 1.0 K/uL   Eosinophils Relative 1 %   Eosinophils Absolute 0.2 0.0 - 0.5 K/uL   Basophils Relative 0 %   Basophils Absolute 0.0 0.0 - 0.1 K/uL   Immature Granulocytes 1 %   Abs Immature Granulocytes 0.06 0.00 - 0.07 K/uL    Comment: Performed at [facility], [address] [place], [place] [address]  Comprehensive metabolic panel with GFR     Status: Abnormal   Collection Time: 04/26/24  6:15 AM  Result Value Ref Range   Sodium 137 135 - 145 mmol/L   Potassium 3.6 3.5 - 5.1 mmol/L   Chloride 103 98 - 111 mmol/L   CO2 24 22 - 32 mmol/L   Glucose, Bld 103 (H) 70 - 99 mg/dL    Comment: Glucose reference range applies only to samples taken after fasting for at least 8  hours.   BUN 8 6 - 20 mg/dL   Creatinine, Ser 8.65 0.61 - 1.24 mg/dL   Calcium  8.6 (L) 8.9 - 10.3 mg/dL   Total Protein 7.1 6.5 - 8.1 g/dL   Albumin 3.2 (L) 3.5 - 5.0 g/dL   AST 16 15 - 41 U/L   ALT 22 0 - 44 U/L   Alkaline Phosphatase 65 38 - 126 U/L   Total Bilirubin 0.9 0.0 - 1.2 mg/dL   GFR, Estimated >78 >46 mL/min    Comment: (NOTE) Calculated using the CKD-EPI Creatinine Equation (2021)    Anion gap 10 5 - 15    Comment: Performed at [facility], [address], [place], [place] [address]  Magnesium     Status: None   Collection Time: 04/26/24  6:15 AM  Result Value Ref Range   Magnesium 2.0 1.7 - 2.4 mg/dL    Comment: Performed at [facility], [address] [place], [place] [address]  Protime-INR     Status: None   Collection Time: 04/26/24  6:15 AM  Result Value Ref Range   Prothrombin Time 15.0 11.4 - 15.2 seconds   INR 1.2 0.8 - 1.2    Comment: (NOTE) INR goal varies based on device and disease states. Performed at [facility], [address] [place], [place] [address]   Urinalysis, Routine w reflex microscopic -Urine, Clean Catch     Status: None  Collection Time: 04/26/24  7:00 AM  Result Value Ref Range   Color, Urine YELLOW YELLOW   APPearance CLEAR CLEAR   Specific Gravity, Urine 1.010 1.005 - 1.030   pH 6.0 5.0 - 8.0   Glucose, UA NEGATIVE NEGATIVE mg/dL   Hgb urine dipstick NEGATIVE NEGATIVE   Bilirubin Urine NEGATIVE NEGATIVE   Ketones, ur NEGATIVE NEGATIVE mg/dL   Protein, ur NEGATIVE NEGATIVE mg/dL   Nitrite NEGATIVE NEGATIVE   Leukocytes,Ua NEGATIVE NEGATIVE    Comment: Performed at [facility], [address] [place], [place] [address]   CT ABDOMEN PELVIS W CONTRAST Result Date: 04/26/2024 EXAM: CT ABDOMEN AND PELVIS WITH CONTRAST 04/26/2024 01:15:10 AM TECHNIQUE: CT of the abdomen and pelvis was performed with the administration of intravenous contrast.  Multiplanar reformatted images are provided for review. Automated exposure control, iterative reconstruction, and/or weight based adjustment of the mA/kV was utilized to reduce the radiation dose to as low as reasonably achievable. COMPARISON: 04/19/2024 CLINICAL HISTORY: LLQ abdominal pain; suspect complicated diverticulitis. Patient c/o abdominal pain x 5 days. Patient report patient got diagnose with diverticulitis and got discharge with P.O. Antibiotics. Patient report taking PRN pain medication without relief. Patient report nausea/ vomiting x 1. FINDINGS: LOWER CHEST: Mild bibasilar atelectasis. HEPATOBILIARY: The liver is unremarkable. Gallbladder is unremarkable. No biliary ductal dilatation. SPLEEN: No acute abnormality. PANCREAS: No acute abnormality. ADRENAL GLANDS: No acute abnormality. KIDNEYS, URETERS AND BLADDER: No stones in the kidneys or ureters. No evidence of hydronephrosis. No evidence of perinephric or periureteral stranding. Urinary bladder is unremarkable. GI AND BOWEL: Stomach demonstrates no acute abnormality. There is no evidence of bowel obstruction. Acute sigmoid diverticulitis, mildly progressive, with mesenteric gas and ill-defined fluid measuring up to 2.6 cm, raising concern for early developing pericolonic abscess. Normal appendix. PERITONEUM AND RETROPERITONEUM: No free air. Aorta is normal in caliber. LYMPH NODES: No evidence of lymphadenopathy. REPRODUCTIVE ORGANS: No acute abnormality. BONES AND SOFT TISSUES: No acute osseous abnormality. Tiny fat-containing right inguinal hernia. IMPRESSION: 1. Acute sigmoid diverticulitis, mildly progressive, with early developing pericolonic abscess. No free air. Electronically signed by: Zadie Herter MD 04/26/2024 01:21 AM EDT RP Workstation: [id number]      Assessment/Plan Diverticulitis with phlegmon The patient has been seen, examined, labs, chart, imaging personally reviewed.  It appears the patient is having his first episode  of diverticulitis but unfortunately refractory to oral outpatient Augmentin .  He has developed a phlegmon with his diverticulitis but no evidence of further abscess or perforation.  The patient's white blood cell count is improving and his pain is already improved since admission last night.  We will allow clear liquids today.  I discussed with the patient the expectation of conservative management and improvement with this; however, we did discuss that if he were to fail conservative management he may require further imaging, drain placement, or surgery with a colectomy/colostomy.  He understands all of this.  All questions were answered.  We will continue to follow with you.   FEN - CLD VTE -okay for chemical prophylaxis from our standpoint ID -Zosyn   I reviewed hospitalist notes, last 24 h vitals and pain scores, last 48 h intake and output, last 24 h labs and trends, and last 24 h imaging results.  Leone Ralphs, [facility] 04/26/2024, 2:35 PM Please see Amion for pager number during day hours 7:00am-4:30pm or 7:00am -11:30am on weekends

## 2024-04-27 DIAGNOSIS — K5792 Diverticulitis of intestine, part unspecified, without perforation or abscess without bleeding: Secondary | ICD-10-CM | POA: Diagnosis not present

## 2024-04-27 LAB — CBC
HCT: 35.2 % — ABNORMAL LOW (ref 39.0–52.0)
Hemoglobin: 12.1 g/dL — ABNORMAL LOW (ref 13.0–17.0)
MCH: 30.6 pg (ref 26.0–34.0)
MCHC: 34.4 g/dL (ref 30.0–36.0)
MCV: 88.9 fL (ref 80.0–100.0)
Platelets: 390 10*3/uL (ref 150–400)
RBC: 3.96 MIL/uL — ABNORMAL LOW (ref 4.22–5.81)
RDW: 12.6 % (ref 11.5–15.5)
WBC: 8.9 10*3/uL (ref 4.0–10.5)
nRBC: 0 % (ref 0.0–0.2)

## 2024-04-27 MED ORDER — ENOXAPARIN SODIUM 40 MG/0.4ML IJ SOSY
40.0000 mg | PREFILLED_SYRINGE | INTRAMUSCULAR | Status: DC
  Administered 2024-04-27 – 2024-04-30 (×3): 40 mg via SUBCUTANEOUS
  Filled 2024-04-27 (×2): qty 0.4

## 2024-04-27 NOTE — Hospital Course (Signed)
 48 y.o. M with obesity, history diverticulitis who presented with diverticulitis worsening despite outpatient Augmentin .  In the ER, CT showed early developing abscess.

## 2024-04-27 NOTE — Progress Notes (Signed)
  Progress Note   Patient: Ryan Vargas HQI:[id number] DOB: [birth year] DOA: 04/25/2024     1 DOS: the patient was seen and examined on 04/27/2024 at 9:05AM      Brief hospital course: 48 y.o. M with obesity, history diverticulitis who presented with diverticulitis worsening despite outpatient Augmentin .  In the ER, CT showed early developing abscess.     Assessment and Plan: Acute diverticulitis Doing well, pain still present but improving.  WBC normal. - Continue Zosyn   Obesity Class 1, complicates care         Subjective: Pain improving.  No vomiting.  Tolerating liquid well. No fever.     Physical Exam: BP 133/84 (BP Location: Right Arm)   Pulse 80   Temp 98.6 F (37 C)   Resp 17   Ht 6\' 4"  (1.93 m)   Wt 112.4 kg   SpO2 100%   BMI 30.16 kg/m   Adult male, sitting up in bed, interactive and appropriate RRR, no murmurs, no peripheral edema Respiratory normal, respiratory wheezes Abdomen soft, tenderness in the left lower quadrant, no guarding, no rigidity, no rebound Attention normal, affect normal, judgment insight appear normal, face symmetric, speech fluent    Data Reviewed: Comprehensive metabolic panel and CBC normal  Family Communication: None present    Disposition: Status is: Inpatient         Author: Ephriam Hashimoto, MD 04/27/2024 1:39 PM  For on call review [email or web address].

## 2024-04-27 NOTE — Plan of Care (Signed)

## 2024-04-27 NOTE — Progress Notes (Signed)
 [facility] Progress Note     Subjective: CC:  NAEO. Feels his LLQ pain is slightly better. Reports watery clear/green stools. +flatus. Tolerating CLD without nausea or vomiting.  Objective: Vital signs in last 24 hours: Temp:  [97.5 F (36.4 C)-98.7 F (37.1 C)] 98.7 F (37.1 C) (05/02 0402) Pulse Rate:  [66-80] 75 (05/02 0402) Resp:  [16-20] 16 (05/02 0402) BP: (105-141)/(67-81) 129/79 (05/02 0402) SpO2:  [97 %-100 %] 100 % (05/02 0402) Weight:  [112.4 kg] 112.4 kg (05/01 1334) Last BM Date : 04/26/24  Intake/Output from previous day: 05/01 0701 - 05/02 0700 In: 1757 [P.O.:360; I.V.:1352; IV Piggyback:45] Out: 450 [Urine:450] Intake/Output this shift: No intake/output data recorded.  PE: Gen:  Alert, NAD, pleasant Card:  Regular rate and rhythm Pulm:  Normal effort ORA Abd: soft, mildly tender in the left lower quadrant with no guarding or rebound, ND, +BS, no masses, hernias, or organomegaly  Skin: warm and dry, no rashes  Psych: A&Ox3   Lab Results:  Recent Labs    04/26/24 0615 04/27/24 0513  WBC 12.8* 8.9  HGB 13.7 12.1*  HCT 40.2 35.2*  PLT 422* 390   BMET Recent Labs    04/25/24 2246 04/26/24 0615  NA 138 137  K 3.7 3.6  CL 103 103  CO2 23 24  GLUCOSE 121* 103*  BUN 9 8  CREATININE 1.02 0.77  CALCIUM  9.4 8.6*   PT/INR Recent Labs    04/26/24 0615  LABPROT 15.0  INR 1.2   CMP     Component Value Date/Time   NA 137 04/26/2024 0615   NA 139 09/01/2023 1134   K 3.6 04/26/2024 0615   CL 103 04/26/2024 0615   CO2 24 04/26/2024 0615   GLUCOSE 103 (H) 04/26/2024 0615   BUN 8 04/26/2024 0615   BUN 8 09/01/2023 1134   CREATININE 0.77 04/26/2024 0615   CREATININE 1.15 08/25/2022 0000   CALCIUM  8.6 (L) 04/26/2024 0615   PROT 7.1 04/26/2024 0615   PROT 6.7 09/01/2023 1134   ALBUMIN 3.2 (L) 04/26/2024 0615   ALBUMIN 4.5 09/01/2023 1134   AST 16 04/26/2024 0615   ALT 22 04/26/2024 0615   ALKPHOS 65 04/26/2024 0615   BILITOT  0.9 04/26/2024 0615   BILITOT 1.3 (H) 09/01/2023 1134   GFRNONAA >60 04/26/2024 0615   GFRNONAA 81 02/06/2019 0914   GFRAA 94 02/06/2019 0914   Lipase     Component Value Date/Time   LIPASE 29 04/25/2024 2246       Studies/Results: CT ABDOMEN PELVIS W CONTRAST Result Date: 04/26/2024 EXAM: CT ABDOMEN AND PELVIS WITH CONTRAST 04/26/2024 01:15:10 AM TECHNIQUE: CT of the abdomen and pelvis was performed with the administration of intravenous contrast. Multiplanar reformatted images are provided for review. Automated exposure control, iterative reconstruction, and/or weight based adjustment of the mA/kV was utilized to reduce the radiation dose to as low as reasonably achievable. COMPARISON: 04/19/2024 CLINICAL HISTORY: LLQ abdominal pain; suspect complicated diverticulitis. Patient c/o abdominal pain x 5 days. Patient report patient got diagnose with diverticulitis and got discharge with P.O. Antibiotics. Patient report taking PRN pain medication without relief. Patient report nausea/ vomiting x 1. FINDINGS: LOWER CHEST: Mild bibasilar atelectasis. HEPATOBILIARY: The liver is unremarkable. Gallbladder is unremarkable. No biliary ductal dilatation. SPLEEN: No acute abnormality. PANCREAS: No acute abnormality. ADRENAL GLANDS: No acute abnormality. KIDNEYS, URETERS AND BLADDER: No stones in the kidneys or ureters. No evidence of hydronephrosis. No evidence of perinephric or periureteral stranding. Urinary bladder is  unremarkable. GI AND BOWEL: Stomach demonstrates no acute abnormality. There is no evidence of bowel obstruction. Acute sigmoid diverticulitis, mildly progressive, with mesenteric gas and ill-defined fluid measuring up to 2.6 cm, raising concern for early developing pericolonic abscess. Normal appendix. PERITONEUM AND RETROPERITONEUM: No free air. Aorta is normal in caliber. LYMPH NODES: No evidence of lymphadenopathy. REPRODUCTIVE ORGANS: No acute abnormality. BONES AND SOFT TISSUES: No acute  osseous abnormality. Tiny fat-containing right inguinal hernia. IMPRESSION: 1. Acute sigmoid diverticulitis, mildly progressive, with early developing pericolonic abscess. No free air. Electronically signed by: Zadie Herter MD 04/26/2024 01:21 AM EDT RP Workstation: [id number]    Anti-infectives: Anti-infectives (From admission, onward)    Start     Dose/Rate Route Frequency Ordered Stop   04/26/24 1400  piperacillin -tazobactam (ZOSYN ) IVPB 3.375 g  Status:  Discontinued        3.375 g 100 mL/hr over 30 Minutes Intravenous Every 8 hours 04/26/24 1102 04/26/24 1104   04/26/24 1400  piperacillin -tazobactam (ZOSYN ) IVPB 3.375 g        3.375 g 12.5 mL/hr over 240 Minutes Intravenous Every 8 hours 04/26/24 1104     04/26/24 1000  ciprofloxacin  (CIPRO ) IVPB 400 mg  Status:  Discontinued        400 mg 200 mL/hr over 60 Minutes Intravenous Every 12 hours 04/26/24 0238 04/26/24 1102   04/25/24 2330  ciprofloxacin  (CIPRO ) IVPB 400 mg        400 mg 200 mL/hr over 60 Minutes Intravenous  Once 04/25/24 2318 04/26/24 0046   04/25/24 2330  metroNIDAZOLE  (FLAGYL ) IVPB 500 mg  Status:  Discontinued        500 mg 100 mL/hr over 60 Minutes Intravenous Every 12 hours 04/25/24 2318 04/26/24 1102        Assessment/Plan  Diverticulitis with phlegmon  AFVSS, WBC has normalized to 8.9 Clinically improving with less pain, tolerating CLD Advance to FLD Continue IV abx  Continue attempted non-operative management. No emergent surgical needs.  FEN: FLD ID: Zosyn  VTE: SCD's, ok for chemical VTE ppx from [facility] standpoint  Dispo: Med-surg, IV abx     LOS: 1 day   I reviewed nursing notes, hospitalist notes, last 24 h vitals and pain scores, last 48 h intake and output, last 24 h labs and trends, and last 24 h imaging results.  This care required moderate level of medical decision making.   Michial Akin, PA-C [facility] Please see Amion for pager number during day hours  7:00am-4:30pm

## 2024-04-27 NOTE — Plan of Care (Signed)

## 2024-04-27 NOTE — Progress Notes (Signed)
   04/27/24 1113  TOC Brief Assessment  Insurance and Status Reviewed  Patient has primary care physician Yes  Home environment has been reviewed single family home  Prior level of function: independent  Prior/Current Home Services No current home services  Social Drivers of Health Review SDOH reviewed no interventions necessary  Readmission risk has been reviewed Yes  Transition of care needs no transition of care needs at this time    [place], MSW, LCSW 04/27/2024 11:13 AM

## 2024-04-28 DIAGNOSIS — K5792 Diverticulitis of intestine, part unspecified, without perforation or abscess without bleeding: Secondary | ICD-10-CM | POA: Diagnosis not present

## 2024-04-28 NOTE — Plan of Care (Signed)

## 2024-04-28 NOTE — Progress Notes (Signed)
   Subjective/Chief Complaint: Patient had a couple of loose stools Still having some LLQ tenderness to palpation Was advanced to soft diet last night - no nausea or vomiting   Objective: Vital signs in last 24 hours: Temp:  [98.4 F (36.9 C)-100.2 F (37.9 C)] 98.9 F (37.2 C) (05/03 0608) Pulse Rate:  [72-80] 73 (05/03 0608) Resp:  [17-20] 20 (05/03 0608) BP: (117-133)/(72-84) 117/72 (05/03 0608) SpO2:  [100 %] 100 % (05/03 0608) Last BM Date : 04/27/24  Intake/Output from previous day: 05/02 0701 - 05/03 0700 In: 325 [P.O.:120; IV Piggyback:205] Out: 2100 [Urine:2100] Intake/Output this shift: No intake/output data recorded.  WDWN in NAD Abd - soft, mildly tender in LLQ; non-distended  Lab Results:  Recent Labs    04/26/24 0615 04/27/24 0513  WBC 12.8* 8.9  HGB 13.7 12.1*  HCT 40.2 35.2*  PLT 422* 390   BMET Recent Labs    04/25/24 2246 04/26/24 0615  NA 138 137  K 3.7 3.6  CL 103 103  CO2 23 24  GLUCOSE 121* 103*  BUN 9 8  CREATININE 1.02 0.77  CALCIUM  9.4 8.6*   PT/INR Recent Labs    04/26/24 0615  LABPROT 15.0  INR 1.2     Anti-infectives: Anti-infectives (From admission, onward)    Start     Dose/Rate Route Frequency Ordered Stop   04/26/24 1400  piperacillin -tazobactam (ZOSYN ) IVPB 3.375 g  Status:  Discontinued        3.375 g 100 mL/hr over 30 Minutes Intravenous Every 8 hours 04/26/24 1102 04/26/24 1104   04/26/24 1400  piperacillin -tazobactam (ZOSYN ) IVPB 3.375 g        3.375 g 12.5 mL/hr over 240 Minutes Intravenous Every 8 hours 04/26/24 1104     04/26/24 1000  ciprofloxacin  (CIPRO ) IVPB 400 mg  Status:  Discontinued        400 mg 200 mL/hr over 60 Minutes Intravenous Every 12 hours 04/26/24 0238 04/26/24 1102   04/25/24 2330  ciprofloxacin  (CIPRO ) IVPB 400 mg        400 mg 200 mL/hr over 60 Minutes Intravenous  Once 04/25/24 2318 04/26/24 0046   04/25/24 2330  metroNIDAZOLE  (FLAGYL ) IVPB 500 mg  Status:  Discontinued         500 mg 100 mL/hr over 60 Minutes Intravenous Every 12 hours 04/25/24 2318 04/26/24 1102       Assessment/Plan: Diverticulitis with phlegmon  AFVSS, WBC was normalized to 8.9 04/27/24 Clinically improving with less pain, tolerating soft diet Still with some persistent LLQ tenderness Continue IV abx - Zosyn .  Will convert to Augmentin  priot to discharge Continue non-operative management. No emergent surgical needs. Recheck labs in AM.  Probable discharge 04/29/24   FEN: soft ID: Zosyn  VTE: SCD's, ok for chemical VTE ppx from [facility] standpoint  Dispo: Med-surg, IV abx   LOS: 2 days    Rella Cardinal 04/28/2024

## 2024-04-28 NOTE — Plan of Care (Signed)
  Problem: Clinical Measurements: Goal: Will remain free from infection Outcome: Progressing Goal: Diagnostic test results will improve Outcome: Progressing   Problem: Pain Managment: Goal: General experience of comfort will improve and/or be controlled Outcome: Progressing

## 2024-04-28 NOTE — Progress Notes (Signed)
  Progress Note   Patient: Ryan Vargas RUE:[id number] DOB: January 31, 1976 DOA: 04/25/2024     2 DOS: the patient was seen and examined on 04/28/2024 at 10:18AM      Brief hospital course: 48 y.o. M with obesity, history diverticulitis who presented with diverticulitis worsening despite outpatient Augmentin .  In the ER, CT showed early developing abscess.     Assessment and Plan: Acute diverticulitis Doing well, still having pain, tolerating diet. - Continue Zosyn          Subjective: Pain seems slightly better, no difference on my exam.  No vomiting, fever, confusion.     Physical Exam: BP (!) 151/84 (BP Location: Right Arm)   Pulse 66   Temp 98.6 F (37 C) (Oral)   Resp 17   Ht 6\' 4"  (1.93 m)   Wt 112.4 kg   SpO2 100%   BMI 30.16 kg/m   Adult male, sitting up in bed, ambulating in the hall, limping RRR, no murmurs, no peripheral edema Respiratory rate normal, lungs clear without rales or wheezes Abdomen soft, still with tenderness in the left lower quadrant, no change from yesterday, mild guarding, no rigidity, no rebound Attention normal, affect normal, judgment and insight appear normal, generalized weakness, symmetric strength    Data Reviewed: CBC normal  Family Communication: None present    Disposition: Status is: Inpatient         Author: Ephriam Hashimoto, MD 04/28/2024 3:00 PM  For on call review [email or web address].

## 2024-04-29 DIAGNOSIS — K5792 Diverticulitis of intestine, part unspecified, without perforation or abscess without bleeding: Secondary | ICD-10-CM | POA: Diagnosis not present

## 2024-04-29 MED ORDER — DOCUSATE SODIUM 100 MG PO CAPS: 100.0000 mg | ORAL_CAPSULE | Freq: Two times a day (BID) | ORAL | Status: DC | PRN

## 2024-04-29 NOTE — Progress Notes (Signed)
   Subjective/Chief Complaint: Tolerating diet BM yesterday Less LLQ tenderness   Objective: Vital signs in last 24 hours: Temp:  [98.3 F (36.8 C)-98.6 F (37 C)] 98.3 F (36.8 C) (05/04 0528) Pulse Rate:  [66-81] 77 (05/04 0528) Resp:  [17-18] 18 (05/04 0528) BP: (140-151)/(83-86) 145/86 (05/04 0528) SpO2:  [99 %-100 %] 99 % (05/04 0528) Weight:  [409 kg] 108 kg (05/04 0528) Last BM Date : 04/27/24  Intake/Output from previous day: 05/03 0701 - 05/04 0700 In: 56 [IV Piggyback:56] Out: -  Intake/Output this shift: No intake/output data recorded.  WDWN in NAD Abd soft, minimal LLQ tenderness  Lab Results:  Recent Labs    04/27/24 0513  WBC 8.9  HGB 12.1*  HCT 35.2*  PLT 390     Anti-infectives: Anti-infectives (From admission, onward)    Start     Dose/Rate Route Frequency Ordered Stop   04/26/24 1400  piperacillin -tazobactam (ZOSYN ) IVPB 3.375 g  Status:  Discontinued        3.375 g 100 mL/hr over 30 Minutes Intravenous Every 8 hours 04/26/24 1102 04/26/24 1104   04/26/24 1400  piperacillin -tazobactam (ZOSYN ) IVPB 3.375 g        3.375 g 12.5 mL/hr over 240 Minutes Intravenous Every 8 hours 04/26/24 1104     04/26/24 1000  ciprofloxacin  (CIPRO ) IVPB 400 mg  Status:  Discontinued        400 mg 200 mL/hr over 60 Minutes Intravenous Every 12 hours 04/26/24 0238 04/26/24 1102   04/25/24 2330  ciprofloxacin  (CIPRO ) IVPB 400 mg        400 mg 200 mL/hr over 60 Minutes Intravenous  Once 04/25/24 2318 04/26/24 0046   04/25/24 2330  metroNIDAZOLE  (FLAGYL ) IVPB 500 mg  Status:  Discontinued        500 mg 100 mL/hr over 60 Minutes Intravenous Every 12 hours 04/25/24 2318 04/26/24 1102       Assessment/Plan: Diverticulitis with phlegmon  AFVSS, WBC normal Clinically improving with less pain, tolerating soft diet Still with some mild LLQ tenderness Convert to Augmentin  prior to discharge Continue non-operative management. No emergent surgical needs.  OK for  discharge per primary team on a ten-day course of Augmentin   Follow-up with Dr. Lucienne Ryder in 2 weeks   FEN: soft ID: Zosyn  >> Augmentin  VTE: SCD's, ok for chemical VTE ppx from [facility] standpoint  Dispo: Med-surg, IV abx   LOS: 3 days    Ryan Vargas 04/29/2024

## 2024-04-29 NOTE — Plan of Care (Signed)

## 2024-04-29 NOTE — Progress Notes (Signed)
  Progress Note   Patient: Ryan Vargas EAV:[id number] DOB: [birth year] DOA: 04/25/2024     3 DOS: the patient was seen and examined on 04/29/2024 at 11:10AM and 4:20 PM      Brief hospital course: 48 y.o. M with obesity, history diverticulitis who presented with diverticulitis worsening despite outpatient Augmentin .  In the ER, CT showed early developing abscess.     Assessment and Plan: Acute diverticulitis Appetite good, bowel movements okay, tolerating diet.  Still having pain in the left lower quadrant.  Given his ongoing pain and findings on imaging, I feel that he should continue IV antibiotics. - Continue Zosyn         Subjective: As above, appetite good, bowel movements soft, tolerating diet.     Physical Exam: BP 134/70 (BP Location: Right Arm)   Pulse 69   Temp 98.9 F (37.2 C) (Oral)   Resp 18   Ht 6\' 4"  (1.93 m)   Wt 108 kg   SpO2 100%   BMI 28.98 kg/m   Adult male, lying in bed, interactive and appropriate RRR, no murmurs, no peripheral edema Respiratory rate normal, lungs clear without rales or wheezes Abdomen soft, winces with palpation of the left lower quadrant Attention normal, affect appropriate, judgment insight appear normal  Data Reviewed:   Family Communication:     Disposition: Status is: Inpatient         Author: Ephriam Hashimoto, MD 04/29/2024 5:19 PM  For on call review [email or web address].

## 2024-04-30 ENCOUNTER — Other Ambulatory Visit (HOSPITAL_COMMUNITY): Payer: Self-pay

## 2024-04-30 DIAGNOSIS — K5792 Diverticulitis of intestine, part unspecified, without perforation or abscess without bleeding: Secondary | ICD-10-CM | POA: Diagnosis not present

## 2024-04-30 MED ORDER — METRONIDAZOLE 500 MG PO TABS
500.0000 mg | ORAL_TABLET | Freq: Three times a day (TID) | ORAL | 0 refills | Status: AC
  Filled 2024-04-30: qty 15, 5d supply, fill #0

## 2024-04-30 MED ORDER — CIPROFLOXACIN HCL 500 MG PO TABS
500.0000 mg | ORAL_TABLET | Freq: Two times a day (BID) | ORAL | 0 refills | Status: AC
  Filled 2024-04-30: qty 10, 5d supply, fill #0

## 2024-04-30 NOTE — Discharge Summary (Signed)
 Physician Discharge Summary   Patient: Ryan Vargas MRN: [id number] DOB: 12/03/1976  Admit date:     04/25/2024  Discharge date: 04/30/24  Discharge Physician: Ephriam Hashimoto   PCP: Cydney Draft, MD     Recommendations at discharge:  Follow up with Dr. Leonides Ramp for diverticulitis Dr. Valley Gavia: Please arrange colonoscopy for diverticulitis or not, as appropriate     Discharge Diagnoses: Principal Problem:   Acute diverticulitis Active Problems:   Class 1 obesity      Hospital Course: 48 y.o. M with obesity, history diverticulitis who presented with diverticulitis worsening despite outpatient Augmentin .  In the ER, CT showed early developing abscess.    Acute diverticulitis with phlegmon The patient was admitted and started on Zosyn .  Serial abdominal exams gradually improved until he was pain-free, white count normalized, he was able to tolerate solid diet, bowel movements were solid.  Discharged to complete 10 days total with Cipro  and Flagyl .  Patient had colonoscopy he reports just a few months ago, defer follow-up colonoscopy to Dr. Valley Gavia.           [place]  Controlled Substances Registry was reviewed for this patient prior to discharge.   Consultants: General Surgery Procedures performed: CT abdomen  Disposition: Home Diet recommendation:  High fiber  DISCHARGE MEDICATION: Allergies as of 04/30/2024       Reactions   Pollen Extract         Medication List     STOP taking these medications    amoxicillin -clavulanate 875-125 MG tablet Commonly known as: AUGMENTIN        TAKE these medications    ciprofloxacin  500 MG tablet Commonly known as: Cipro  Take 1 tablet (500 mg total) by mouth 2 (two) times daily for 5 days.   ibuprofen 800 MG tablet Commonly known as: ADVIL Take 800 mg by mouth every 6 (six) hours as needed.   meloxicam  15 MG tablet Commonly known as: MOBIC  One tab PO every 24 hours with a  meal for 2 weeks, then once every 24 hours prn pain.   metroNIDAZOLE  500 MG tablet Commonly known as: Flagyl  Take 1 tablet (500 mg total) by mouth 3 (three) times daily for 5 days.   ondansetron  8 MG disintegrating tablet Commonly known as: ZOFRAN -ODT Take 1 tablet (8 mg total) by mouth every 8 (eight) hours as needed for nausea or vomiting.        Follow-up Information     Cydney Draft, MD. Schedule an appointment as soon as possible for a visit in 1 week(s).   Specialty: Family Medicine Contact information: [address] [place] [address] [phone]                 Discharge Instructions     Discharge instructions   Complete by: As directed    **IMPORTANT DISCHARGE INSTRUCTIONS**   From Dr. Darlyn Eke: You were admitted with diverticulitis You had a CT scan that showed a developing a phlegmon, which is a boggy, inflamed area around the diverticulitis.  If untreated, this develops into an abscess.  You were treated here with Zosyn  and should continue antibiotics for 5 more days.  Take Ciprofloxacin  500 mg twice daily Take metronidazole /Flagyl  500 mg three times daily  Take Flagyl  with food, because it can cause nausea If you have nausea, take some of the ondansetron /Zofran  antinausea medicine you were prescribed  Start the antibiotics today (take a dose of cipro  and flagly with lunch today and  then again this evening before bed with a snack) Then tomorrow go to Cipro /flagyl  with breakfast and 8pm snack and another dose of Flagyl  with lunch.  If you have fever, or OBVIOUS worsening pain, call your doctor  Go see Dr. Zenovia Hilding in 1 week  Follow the high fiber diet instructions we discussed   Increase activity slowly   Complete by: As directed        Discharge Exam: Filed Weights   04/26/24 1334 04/29/24 0528 04/30/24 0500  Weight: 112.4 kg 108 kg 111.9 kg    General: Pt is alert, awake, not in acute  distress Cardiovascular: RRR, nl S1-S2, no murmurs appreciated.   No LE edema.   Respiratory: Normal respiratory rate and rhythm.  CTAB without rales or wheezes. Abdominal: Abdomen soft and non-tender.  No distension or HSM.   Neuro/Psych: Strength symmetric in upper and lower extremities.  Judgment and insight appear normal.   Condition at discharge: good  The results of significant diagnostics from this hospitalization (including imaging, microbiology, ancillary and laboratory) are listed below for reference.   Imaging Studies: CT ABDOMEN PELVIS W CONTRAST Result Date: 04/26/2024 EXAM: CT ABDOMEN AND PELVIS WITH CONTRAST 04/26/2024 01:15:10 AM TECHNIQUE: CT of the abdomen and pelvis was performed with the administration of intravenous contrast. Multiplanar reformatted images are provided for review. Automated exposure control, iterative reconstruction, and/or weight based adjustment of the mA/kV was utilized to reduce the radiation dose to as low as reasonably achievable. COMPARISON: 04/19/2024 CLINICAL HISTORY: LLQ abdominal pain; suspect complicated diverticulitis. Patient c/o abdominal pain x 5 days. Patient report patient got diagnose with diverticulitis and got discharge with P.O. Antibiotics. Patient report taking PRN pain medication without relief. Patient report nausea/ vomiting x 1. FINDINGS: LOWER CHEST: Mild bibasilar atelectasis. HEPATOBILIARY: The liver is unremarkable. Gallbladder is unremarkable. No biliary ductal dilatation. SPLEEN: No acute abnormality. PANCREAS: No acute abnormality. ADRENAL GLANDS: No acute abnormality. KIDNEYS, URETERS AND BLADDER: No stones in the kidneys or ureters. No evidence of hydronephrosis. No evidence of perinephric or periureteral stranding. Urinary bladder is unremarkable. GI AND BOWEL: Stomach demonstrates no acute abnormality. There is no evidence of bowel obstruction. Acute sigmoid diverticulitis, mildly progressive, with mesenteric gas and ill-defined  fluid measuring up to 2.6 cm, raising concern for early developing pericolonic abscess. Normal appendix. PERITONEUM AND RETROPERITONEUM: No free air. Aorta is normal in caliber. LYMPH NODES: No evidence of lymphadenopathy. REPRODUCTIVE ORGANS: No acute abnormality. BONES AND SOFT TISSUES: No acute osseous abnormality. Tiny fat-containing right inguinal hernia. IMPRESSION: 1. Acute sigmoid diverticulitis, mildly progressive, with early developing pericolonic abscess. No free air. Electronically signed by: Zadie Herter MD 04/26/2024 01:21 AM EDT RP Workstation: [id number]   CT ABDOMEN PELVIS W CONTRAST Result Date: 04/19/2024 CLINICAL DATA:  LLQ abdominal pain EXAM: CT ABDOMEN AND PELVIS WITH CONTRAST TECHNIQUE: Multidetector CT imaging of the abdomen and pelvis was performed using the standard protocol following bolus administration of intravenous contrast. RADIATION DOSE REDUCTION: This exam was performed according to the departmental dose-optimization program which includes automated exposure control, adjustment of the mA and/or kV according to patient size and/or use of iterative reconstruction technique. CONTRAST:  OMNIPAQUE  IOHEXOL  300 MG/ML  SOLN COMPARISON:  CT abdomen pelvis 09/01/2023 FINDINGS: Lower chest: Bilateral lower lobe atelectasis. No acute abnormality. Hepatobiliary: No focal liver abnormality. No gallstones, gallbladder wall thickening, or pericholecystic fluid. No biliary dilatation. Pancreas: No focal lesion. Normal pancreatic contour. No surrounding inflammatory changes. No main pancreatic ductal dilatation. Spleen: Normal in size without focal abnormality.  Adrenals/Urinary Tract: No adrenal nodule bilaterally. Bilateral kidneys enhance symmetrically. No hydronephrosis. No hydroureter. The urinary bladder is unremarkable. Stomach/Bowel: Stomach is within normal limits. No evidence of small bowel wall thickening or dilatation. Bowel thickening and pericolonic fat stranding  surrounding a focal diverticula of the proximal mid sigmoid colon. Appendix appears normal. Vascular/Lymphatic: No abdominal aorta or iliac aneurysm. Mild atherosclerotic plaque of the aorta and its branches. No abdominal, pelvic, or inguinal lymphadenopathy. Reproductive: Prostate is unremarkable. Other: No intraperitoneal free fluid. No intraperitoneal free gas. No organized fluid collection. Musculoskeletal: No abdominal wall hernia or abnormality. No suspicious lytic or blastic osseous lesions. No acute displaced fracture. Multilevel degenerative changes of the spine. IMPRESSION: 1. Colonic diverticulosis with acute uncomplicated diverticulitis of the proximal to mid sigmoid colon. Recommend colonoscopy status post treatment and status post complete resolution of inflammatory changes to exclude an underlying lesion. 2.  Aortic Atherosclerosis (ICD10-I70.0). Electronically Signed   By: Morgane  Naveau M.D.   On: 04/19/2024 22:30   DG Chest Port 1 View Result Date: 04/19/2024 CLINICAL DATA:  [id number] Suspected sepsis [id number] EXAM: PORTABLE CHEST 1 VIEW COMPARISON:  CT abdomen pelvis 04/19/2024 FINDINGS: Prominent cardiac silhouette likely due to AP portable technique. The heart and mediastinal contours are otherwise within normal limits. Bibasilar atelectasis. No focal consolidation. No pulmonary edema. No pleural effusion. No pneumothorax. No acute osseous abnormality. IMPRESSION: No active disease. Electronically Signed   By: Morgane  Naveau M.D.   On: 04/19/2024 21:47    Microbiology: Results for orders placed or performed during the hospital encounter of 04/19/24  Culture, blood (Routine x 2)     Status: None   Collection Time: 04/19/24  8:17 PM   Specimen: BLOOD  Result Value Ref Range Status   Specimen Description   Final    BLOOD RIGHT ANTECUBITAL Performed at [facility], [address], [place], [place] [address]    Special Requests   Final    BOTTLES DRAWN AEROBIC AND  ANAEROBIC Blood Culture results may not be optimal due to an inadequate volume of blood received in culture bottles Performed at [facility], [address] [place], [place] [address]    Culture   Final    NO GROWTH 5 DAYS Performed at [facility], [address], [place], [place] [address]    Report Status 04/24/2024 FINAL  Final  Culture, blood (Routine x 2)     Status: None   Collection Time: 04/19/24  9:03 PM   Specimen: BLOOD  Result Value Ref Range Status   Specimen Description   Final    BLOOD LEFT ANTECUBITAL Performed at [facility], [address] [place], [place] [address]    Special Requests   Final    BOTTLES DRAWN AEROBIC AND ANAEROBIC Blood Culture adequate volume Performed at [facility], [address] [place], [place] [address]    Culture   Final    NO GROWTH 5 DAYS Performed at [facility], [address], [place], [place] [address]    Report Status 04/24/2024 FINAL  Final    Labs: CBC: Recent Labs  Lab 04/25/24 2246 04/26/24 0615 04/27/24 0513  WBC 14.3* 12.8* 8.9  NEUTROABS  --  9.8*  --   HGB 14.9 13.7 12.1*  HCT 42.3 40.2 35.2*  MCV 87.4 88.9 88.9  PLT 450* 422* 390   Basic Metabolic Panel: Recent Labs  Lab 04/25/24 2246 04/26/24 0615  NA 138 137  K 3.7 3.6  CL 103 103  CO2 23 24  GLUCOSE 121* 103*  BUN 9 8  CREATININE 1.02 0.77  CALCIUM  9.4 8.6*  MG  --  2.0   Liver Function Tests: Recent Labs  Lab 04/25/24 2246 04/26/24 0615  AST 22 16  ALT 27 22  ALKPHOS 77 65  BILITOT 1.0 0.9  PROT 8.0 7.1  ALBUMIN 3.8 3.2*   CBG: No results for input(s): "GLUCAP" in the last 168 hours.  Discharge time spent: approximately 45 minutes spent on discharge counseling, evaluation of patient on day of discharge, and coordination of discharge planning with nursing, social work, pharmacy and case management  Signed: Ephriam Hashimoto, MD [facility] 04/30/2024

## 2024-04-30 NOTE — Progress Notes (Signed)
 Discharge medications delivered to bedside [facility]

## 2024-04-30 NOTE — Plan of Care (Signed)
  Problem: Education: Goal: Knowledge of General Education information will improve Description: Including pain rating scale, medication(s)/side effects and non-pharmacologic comfort measures Outcome: Progressing   Problem: Health Behavior/Discharge Planning: Goal: Ability to manage health-related needs will improve Outcome: Progressing   Problem: Clinical Measurements: Goal: Ability to maintain clinical measurements within normal limits will improve Outcome: Progressing Goal: Diagnostic test results will improve Outcome: Progressing Goal: Respiratory complications will improve Outcome: Progressing Goal: Cardiovascular complication will be avoided Outcome: Progressing   Problem: Activity: Goal: Risk for activity intolerance will decrease Outcome: Progressing   Problem: Nutrition: Goal: Adequate nutrition will be maintained Outcome: Progressing   Problem: Coping: Goal: Level of anxiety will decrease Outcome: Progressing   Problem: Elimination: Goal: Will not experience complications related to bowel motility Outcome: Progressing Goal: Will not experience complications related to urinary retention Outcome: Progressing   Problem: Pain Managment: Goal: General experience of comfort will improve and/or be controlled Outcome: Progressing   Problem: Safety: Goal: Ability to remain free from injury will improve Outcome: Progressing   Problem: Skin Integrity: Goal: Risk for impaired skin integrity will decrease Outcome: Progressing   Problem: Clinical Measurements: Goal: Will remain free from infection Outcome: Not Progressing

## 2024-05-01 ENCOUNTER — Ambulatory Visit: Payer: Self-pay

## 2024-05-01 ENCOUNTER — Telehealth: Payer: Self-pay

## 2024-05-01 NOTE — Telephone Encounter (Signed)
 Copied from CRM [id number]. Topic: Clinical - Red Word Triage >> May 01, 2024  8:12 AM Lenon Radar A wrote: Red Word that prompted transfer to Nurse Triage: Not eating < 24hrs, severe pain body aches  Chief Complaint: body/muscle aches, not drinking, not eating, just discharged from the hospital Symptoms: see above Frequency: constant Pertinent Negatives: Patient denies NA Disposition: [x] ED /[] Urgent Care (no appt availability in office) / [] Appointment(In office/virtual)/ []  [place] [facility]/ [] Home Care/ [] Refused Recommended Disposition /[] [place]/ []  Follow-up with PCP Additional Notes: instructed to go to the ER; Pcp office updated.   Reason for Disposition  [1] Drinking very little AND [2] dehydration suspected (e.g., no urine > 12 hours, very dry mouth, very lightheaded)  Answer Assessment - Initial Assessment Questions 1. ONSET: "When did the muscle aches or body pains start?"      Started after discharge from the hospital; was in hospital for diverticulitis.  2. LOCATION: "What part of your body is hurting?" (e.g., entire body, arms, legs)      States severe pain and body aches 3. SEVERITY: "How bad is the pain?" (Scale 1-10; or mild, moderate, severe)   - MILD (1-3): doesn't interfere with normal activities    - MODERATE (4-7): interferes with normal activities or awakens from sleep    - SEVERE (8-10):  excruciating pain, unable to do any normal activities      moderate 4. CAUSE: "What do you think is causing the pains?"     unknown 5. FEVER: "Have you been having fever?"     no 6. OTHER SYMPTOMS: "Do you have any other symptoms?" (e.g., chest pain, weakness, rash, cold or flu symptoms, weight loss)     States not eating 7. PREGNANCY: "Is there any chance you are pregnant?" "When was your last menstrual period?"     na 8. TRAVEL: "Have you traveled out of the country in the last month?" (e.g., travel history, exposures)     no  Protocols used: Muscle Aches  and Body Pain-A-AH

## 2024-05-01 NOTE — Telephone Encounter (Signed)
 FYI

## 2024-05-01 NOTE — Transitions of Care (Post Inpatient/ED Visit) (Signed)
   05/01/2024  Name: Ryan Vargas MRN: [id number] DOB: 02-06-1976  Today's TOC FU Call Status: Today's TOC FU Call Status:: Successful TOC FU Call Completed TOC FU Call Complete Date: 05/01/24 Patient's Name and Date of Birth confirmed.  Transition Care Management Follow-up Telephone Call Date of Discharge: 04/30/24 Discharge Facility: [facility]) Type of Discharge: Inpatient Admission Primary Inpatient Discharge Diagnosis:: diverticulitis How have you been since you were released from the hospital?: Better Any questions or concerns?: No  Items Reviewed: Did you receive and understand the discharge instructions provided?: Yes Medications obtained,verified, and reconciled?: Yes (Medications Reviewed) Any new allergies since your discharge?: No Dietary orders reviewed?: Yes Do you have support at home?: Yes People in Home [RPT]: spouse  Medications Reviewed Today: Medications Reviewed Today     Reviewed by Darrall Ellison, LPN (Licensed Practical Nurse) on 05/01/24 at [phone]  Med List Status: <None>   Medication Order Taking? Sig Documenting Provider Last Dose Status Informant  ciprofloxacin  (CIPRO ) 500 MG tablet [id number]  Take 1 tablet (500 mg total) by mouth 2 (two) times daily for 5 days. Ephriam Hashimoto, MD  Active   ibuprofen (ADVIL) 800 MG tablet [id number] No Take 800 mg by mouth every 6 (six) hours as needed. [provider] Unknown Active Self, Spouse/Significant Other, Pharmacy Records  meloxicam  (MOBIC ) 15 MG tablet [id number] No One tab PO every 24 hours with a meal for 2 weeks, then once every 24 hours prn pain. Gean Keels, MD Past Week Active Self, Spouse/Significant Other, Pharmacy Records  metroNIDAZOLE  (FLAGYL ) 500 MG tablet [id number]  Take 1 tablet (500 mg total) by mouth 3 (three) times daily for 5 days. Ephriam Hashimoto, MD  Active   ondansetron  (ZOFRAN -ODT) 8 MG disintegrating tablet [id number] No Take 1 tablet (8 mg total) by  mouth every 8 (eight) hours as needed for nausea or vomiting. Guadalupe Lee, MD Unknown Active Self, Spouse/Significant Other, Pharmacy Records            Home Care and Equipment/Supplies: Were Home Health Services Ordered?: NA Any new equipment or medical supplies ordered?: NA  Functional Questionnaire: Do you need assistance with bathing/showering or dressing?: No Do you need assistance with meal preparation?: No Do you need assistance with eating?: No Do you have difficulty maintaining continence: No Do you need assistance with getting out of bed/getting out of a chair/moving?: No Do you have difficulty managing or taking your medications?: No  Follow up appointments reviewed: PCP Follow-up appointment confirmed?: Yes Date of PCP follow-up appointment?: 05/03/24 Follow-up Provider: [facility] Follow-up appointment confirmed?: No Reason Specialist Follow-Up Not Confirmed: Patient has Specialist Provider Number and will Call for Appointment Do you need transportation to your follow-up appointment?: No Do you understand care options if your condition(s) worsen?: Yes-patient verbalized understanding    SIGNATURE Darrall Ellison, LPN [facility] Nurse Health Advisor Direct Dial [phone]

## 2024-05-03 ENCOUNTER — Telehealth: Payer: Self-pay | Admitting: Family Medicine

## 2024-05-03 ENCOUNTER — Encounter: Payer: Self-pay | Admitting: Family Medicine

## 2024-05-03 ENCOUNTER — Ambulatory Visit (INDEPENDENT_AMBULATORY_CARE_PROVIDER_SITE_OTHER): Admitting: Family Medicine

## 2024-05-03 VITALS — BP 119/81 | HR 72 | Ht 74.0 in | Wt 243.1 lb

## 2024-05-03 DIAGNOSIS — K5792 Diverticulitis of intestine, part unspecified, without perforation or abscess without bleeding: Secondary | ICD-10-CM

## 2024-05-03 DIAGNOSIS — M5416 Radiculopathy, lumbar region: Secondary | ICD-10-CM | POA: Diagnosis not present

## 2024-05-03 MED ORDER — TRAMADOL HCL 50 MG PO TABS
50.0000 mg | ORAL_TABLET | Freq: Three times a day (TID) | ORAL | 0 refills | Status: AC | PRN
Start: 2024-05-03 — End: 2024-05-08

## 2024-05-03 NOTE — Progress Notes (Signed)
 Pt reports that he still has about 3 days left on his ABX.

## 2024-05-03 NOTE — Patient Instructions (Signed)
 Make sure to complete all your antibiotics.    Try to reschedule your MRI and we can try the tramadol  at bedtime for your pain

## 2024-05-03 NOTE — Telephone Encounter (Signed)
 Please call his GI office and let them know that he did have a recent hospitalization for diverticulitis.  He just had his colonoscopy last summer so just want to verify whether or not they want to see him he is doing significantly better and is almost done with his course of antibiotics.  I know typically we would recommend colonoscopy after these episodes but again he is within 12 months so it did not want to send him unless they really felt he needed to be seen at this point.

## 2024-05-03 NOTE — Progress Notes (Signed)
 Established Patient Office Visit  Subjective  Patient ID: Ryan Vargas, male    DOB: 09/30/1976  Age: 48 y.o. MRN: [id number]  Chief Complaint  Patient presents with   Hospitalization Follow-up    HPI  He was seen initially in the emergency department on April 23 and diagnosed with diverticulitis.  Count was slightly elevated at 14.  He was given Zofran , pain medication and started on Augmentin .  Unfortunately he went back to the emergency room on April 30 and ended up being admitted.  CT in the ER confirmed worsening phlegmon.  He was started on Zosyn .  As he improved he was finally discharged home on Cipro  and Flagyl .  He is already established with GI.  He is here today for follow-up.  Blood cell count at discharge had trended down to 8.9.  He says he is feeling significantly better.  Just a little bit of discomfort in the abdomen he has had a couple times where he is broken out in a sweat but when he checks his temperature it has been normal.  He has been trying to eat more of a high-fiber diet and has been tolerating that well.  Bowels are still a little bit loose.  ED and hospital notes and labs reviewed.  Still dealing with right sided s sciatica.  He had to cancel his MRI because of everything going on with the diverticulitis he wanted to wait until he finished all his antibiotics before calling back to reschedule but he still having a lot of pain shooting down that right leg he has been told to avoid NSAIDs right now because of the diverticulitis and so wants to know if there is anything else a little stronger that he could take as needed especially at bedtime.    ROS    Objective:     BP 119/81   Pulse 72   Ht 6\' 2"  (1.88 m)   Wt 243 lb 1.3 oz (110.3 kg)   SpO2 98%   BMI 31.21 kg/m    Physical Exam Vitals reviewed.  Constitutional:      Appearance: Normal appearance.  HENT:     Head: Normocephalic.  Pulmonary:     Effort: Pulmonary effort is normal.   Abdominal:     General: Bowel sounds are normal. There is no distension.     Palpations: Abdomen is soft. There is no mass.     Tenderness: There is no guarding.     Comments: Mild tenderness in the LLQ , non tender elsewhere  Neurological:     Mental Status: He is alert and oriented to person, place, and time.  Psychiatric:        Mood and Affect: Mood normal.        Behavior: Behavior normal.      No results found for any visits on 05/03/24.    The 10-year ASCVD risk score (Arnett DK, et al., 2019) is: 4.3%    Assessment & Plan:   Problem List Items Addressed This Visit       Digestive   Acute diverticulitis - Primary   Relevant Medications   traMADol  (ULTRAM ) 50 MG tablet     Nervous and Auditory   Right lumbar radiculitis   He has been told to avoid NSAIDs for right now and can only use Tylenol  we did send over prescription of tramadol  at bedtime.        Diverticulitis he is significantly better.  Appetite is improving bowels are moving  normally.  No fever but still some sweats but he is keeping a close eye on that.  White Blood cell count had come down at discharge.  Just a little mild tenderness left in that left lower quadrant.  Make sure to complete antibiotics which she should be done with on Saturday.  Return if symptoms worsen or fail to improve.    Duaine German, MD

## 2024-05-03 NOTE — Assessment & Plan Note (Signed)
 He has been told to avoid NSAIDs for right now and can only use Tylenol  we did send over prescription of tramadol  at bedtime.

## 2024-05-07 NOTE — Telephone Encounter (Signed)
 I called Dr Valley Gavia office and left a message asking for a recommendation.

## 2024-05-11 NOTE — Telephone Encounter (Signed)
 Spoke with Dr. Valley Gavia office - states that patient is scheduled for a hospital follow  up on May 30th and they will discuss this with the patient at the follow up visit.

## 2024-05-25 DIAGNOSIS — K5792 Diverticulitis of intestine, part unspecified, without perforation or abscess without bleeding: Secondary | ICD-10-CM | POA: Diagnosis not present

## 2024-07-25 DIAGNOSIS — K635 Polyp of colon: Secondary | ICD-10-CM | POA: Diagnosis not present

## 2024-07-25 DIAGNOSIS — K5730 Diverticulosis of large intestine without perforation or abscess without bleeding: Secondary | ICD-10-CM | POA: Diagnosis not present

## 2024-07-25 DIAGNOSIS — Z09 Encounter for follow-up examination after completed treatment for conditions other than malignant neoplasm: Secondary | ICD-10-CM | POA: Diagnosis not present

## 2024-07-25 DIAGNOSIS — Z1211 Encounter for screening for malignant neoplasm of colon: Secondary | ICD-10-CM | POA: Diagnosis not present

## 2024-07-25 DIAGNOSIS — E7800 Pure hypercholesterolemia, unspecified: Secondary | ICD-10-CM | POA: Diagnosis not present

## 2024-08-28 ENCOUNTER — Encounter: Payer: Self-pay | Admitting: Sports Medicine

## 2025-01-06 ENCOUNTER — Encounter (HOSPITAL_COMMUNITY): Payer: Self-pay

## 2025-01-06 ENCOUNTER — Ambulatory Visit (HOSPITAL_COMMUNITY)
Admission: EM | Admit: 2025-01-06 | Discharge: 2025-01-06 | Disposition: A | Discharge status: Home / Self Care | Payer: Self-pay | Attending: Student | Admitting: Student

## 2025-01-06 DIAGNOSIS — H548 Legal blindness, as defined in USA: Secondary | ICD-10-CM

## 2025-01-06 DIAGNOSIS — S0502XA Injury of conjunctiva and corneal abrasion without foreign body, left eye, initial encounter: Secondary | ICD-10-CM

## 2025-01-06 MED ORDER — FLUORESCEIN SODIUM 1 MG OP STRP
ORAL_STRIP | OPHTHALMIC | Status: AC
  Filled 2025-01-06: qty 1

## 2025-01-06 MED ORDER — MOXIFLOXACIN HCL 0.5 % OP SOLN: 1.0000 [drp] | Freq: Three times a day (TID) | OPHTHALMIC | 0 refills | Status: AC

## 2025-01-06 MED ORDER — TETRACAINE HCL 0.5 % OP SOLN
OPHTHALMIC | Status: AC
  Filled 2025-01-06: qty 4

## 2025-01-06 NOTE — ED Provider Notes (Signed)
 " [facility]    CSN: [id number] Arrival date & time: 01/06/25  0801      History   Chief Complaint Chief Complaint  Patient presents with   Eye Problem    HPI KEYANDRE PILEGGI is a 49 y.o. male.  Patient presents to the office for left eye  injury. States was wearing safety glasses but the safety guard went under the glasses and hit his eye. Unsure if eye was open or closed when this happened.- think his eyelid was affected more than the actual eye. He is legally blind in his left eye, and has poor central vision, but has good peripheral vision. Increase in blurry vision from baseline. Notes photophobia. Denies right eye issues today. Has an eye doctor. Wears glasses not contacts.   HPI  Past Medical History:  Diagnosis Date   Arthritis 03/2012   right subtalar navicular arthritis    Patient Active Problem List   Diagnosis Date Noted   Acute diverticulitis 04/26/2024   Class 1 obesity 04/26/2024   Hypoalbuminemia 04/26/2024   Thrombocytosis 04/26/2024   Right lumbar radiculitis 02/24/2024   Bright red blood per rectum 02/24/2024   Left hamstring muscle strain 03/23/2021   Elevated liver enzymes 02/06/2019   IFG (impaired fasting glucose) 02/06/2019   Hypertriglyceridemia, familial 06/08/2017   Blind left eye 06/07/2017    Past Surgical History:  Procedure Laterality Date   ANKLE FUSION  04/20/2012   Procedure: ARTHRODESIS ANKLE;  Surgeon: Norleen Armor, MD;  Location: [facility];  Service: Orthopedics;  Laterality: Right;  RIGHT GASTROC RECESSION, SUBTALAR ARTHRODESIS, TALAR NAVICULAR ARTHRODESIS       Home Medications    Prior to Admission medications  Medication Sig Start Date End Date Taking? Authorizing Provider  moxifloxacin  (VIGAMOX ) 0.5 % ophthalmic solution Place 1 drop into the left eye 3 (three) times daily for 7 days. 01/06/25 01/13/25 Yes Tamarion Haymond E, PA-C  ibuprofen (ADVIL) 800 MG tablet Take 800 mg by mouth every 6 (six)  hours as needed. 12/29/23   [provider]  meloxicam  (MOBIC ) 15 MG tablet One tab PO every 24 hours with a meal for 2 weeks, then once every 24 hours prn pain. 04/06/24   Curtis Debby PARAS, MD  ondansetron  (ZOFRAN -ODT) 8 MG disintegrating tablet Take 1 tablet (8 mg total) by mouth every 8 (eight) hours as needed for nausea or vomiting. 04/19/24   Bernard Drivers, MD    Family History Family History  Problem Relation Age of Onset   Diabetes Father    Hypertension Father     Social History Social History[1]   Allergies   Pollen extract   Review of Systems Review of Systems  Eyes:  Positive for photophobia and visual disturbance. Negative for pain, discharge, redness and itching.     Physical Exam Triage Vital Signs ED Triage Vitals [01/06/25 0817]  Encounter Vitals Group     BP 133/76     Girls Systolic BP Percentile      Girls Diastolic BP Percentile      Boys Systolic BP Percentile      Boys Diastolic BP Percentile      Pulse Rate 76     Resp 16     Temp 98.7 F (37.1 C)     Temp Source Oral     SpO2 98 %     Weight      Height      Head Circumference      Peak Flow  Pain Score      Pain Loc      Pain Education      Exclude from Growth Chart    No data found.  Updated Vital Signs BP 133/76 (BP Location: Left Arm)   Pulse 76   Temp 98.7 F (37.1 C) (Oral)   Resp 16   SpO2 98%   Visual Acuity Right Eye Distance: 20/40 Left Eye Distance: 20/70 Bilateral Distance: 20/40  Right Eye Near: R Near: 20/40 Left Eye Near:  L Near: 20/70 Bilateral Near:  20/40  Physical Exam Vitals reviewed.  Constitutional:      General: He is not in acute distress.    Appearance: Normal appearance. He is not ill-appearing.  HENT:     Head: Normocephalic and atraumatic.  Eyes:     General: Lids are normal. Lids are everted, no foreign bodies appreciated. Vision grossly intact. Gaze aligned appropriately. No visual field deficit.       Right eye: No  foreign body, discharge or hordeolum.        Left eye: No foreign body, discharge or hordeolum.     Conjunctiva/sclera:     Right eye: Right conjunctiva is not injected. No chemosis, exudate or hemorrhage.    Left eye: Left conjunctiva is not injected. No chemosis, exudate or hemorrhage.    Pupils:     Left eye: Corneal abrasion and fluorescein  uptake present. Seidel exam negative.     Comments: Left eye: No lid changes or foreign body present.  Extraocular movements are intact and without pain.  There is no orbital tenderness.  EOMI, PERRLA.  Visual fields full to confrontation.  Visual acuity grossly intact.  Corneal abrasion with fluorescein  uptake present over the inferior aspect of the iris.  Seidel sign negative.  Pulmonary:     Effort: Pulmonary effort is normal.  Neurological:     General: No focal deficit present.     Mental Status: He is alert and oriented to person, place, and time.  Psychiatric:        Mood and Affect: Mood normal.        Behavior: Behavior normal.        Thought Content: Thought content normal.        Judgment: Judgment normal.      UC Treatments / Results  Labs (all labs ordered are listed, but only abnormal results are displayed) Labs Reviewed - No data to display  EKG   Radiology No results found.  Procedures Procedures (including critical care time)  Medications Ordered in UC Medications - No data to display  Initial Impression / Assessment and Plan / UC Course  I have reviewed the triage vital signs and the nursing notes.  Pertinent labs & imaging results that were available during my care of the patient were reviewed by me and considered in my medical decision making (see chart for details).     Patient is a pleasant 49 y.o. male presenting with corneal abrasion L eye. The patient is afebrile and nontachycardic.  Of note, this patient is legally blind in his left eye, with good peripheral vision but poor central vision at baseline.   There is an increase in blurred vision today, though his visual acuity is still grossly intact.  Fluorescein  exam reveals corneal abrasion, but negative Seidel sign.  Vigamox  drops sent.  He has an eye doctor, so he will call them tomorrow for follow-up.  Return precautions as below.  Final Clinical Impressions(s) / UC Diagnoses  Final diagnoses:  Conjunctival abrasion, left, initial encounter  Legally blind in left eye, as defined in [place]      Discharge Instructions      -You have a corneal abrasion on your left eye. We will do antibiotic drops to prevent infection. -Vigamox  drops three times daily x7 days -Warm compresses as needed -Call your eye doctor tomorrow to schedule a follow-up --->If symptoms change or worsen before you can see your eye doctor (vision changes, vision loss, eye swelling) - seek immediate medical attention     ED Prescriptions     Medication Sig Dispense Auth. Provider   moxifloxacin  (VIGAMOX ) 0.5 % ophthalmic solution Place 1 drop into the left eye 3 (three) times daily for 7 days. 3 mL Enslie Sahota E, PA-C      PDMP not reviewed this encounter.     [1]  Social History Tobacco Use   Smoking status: Never   Smokeless tobacco: Never  Substance Use Topics   Alcohol use: No    Comment: quit drinking 12/2011   Drug use: Yes    Comment: occasional     Arlyss Leita BRAVO, PA-C 01/06/25 [id number]  "

## 2025-01-06 NOTE — ED Triage Notes (Signed)
 Patient presents to the office for left eye  injury. PT states the safety guard at work last night hit him in the eye. Patient states blurred vision in his eye.

## 2025-01-06 NOTE — Discharge Instructions (Addendum)
-  You have a corneal abrasion on your left eye. We will do antibiotic drops to prevent infection. -Vigamox  drops three times daily x7 days -Warm compresses as needed -Call your eye doctor tomorrow to schedule a follow-up --->If symptoms change or worsen before you can see your eye doctor (vision changes, vision loss, eye swelling) - seek immediate medical attention

## 2025-01-16 ENCOUNTER — Encounter: Payer: Self-pay | Admitting: Family Medicine

## 2025-01-16 ENCOUNTER — Ambulatory Visit (INDEPENDENT_AMBULATORY_CARE_PROVIDER_SITE_OTHER): Admitting: Family Medicine

## 2025-01-16 VITALS — BP 130/78 | HR 80 | Ht 74.0 in | Wt 251.0 lb

## 2025-01-16 DIAGNOSIS — Z0000 Encounter for general adult medical examination without abnormal findings: Secondary | ICD-10-CM | POA: Diagnosis not present

## 2025-01-16 NOTE — Progress Notes (Signed)
 "  Complete physical exam  Patient: Ryan Vargas    DOB: [birth year] 48 y.o.   MRN: [id number]  Chief Complaint  Patient presents with   Annual Exam    Subjective:    Ryan Vargas is a 49 y.o. male who presents today for a complete physical exam. He reports consuming a general diet. Exercies regularly at home. He generally feels well. He reports sleeping well. He does not have additional problems to discuss today.   Discussed the use of AI scribe software for clinical note transcription with the patient, who gave verbal consent to proceed.  History of Present Illness Ryan Vargas is a 49 year old male who presents for follow-up regarding an eye injury and routine blood work.  Ocular trauma and visual impairment - Sustained an eye injury at work when struck in the eye by a safety rail - Legally blind in the left eye due to a previous injury from a dirt rock, resulting in a scar and chronic visual impairment - Recent injury has caused further reduction in peripheral vision - Received urgent care evaluation: 'landing strip' applied under the eye, fluorescein  dye used for assessment, and a stinging solution administered that temporarily causes blindness  Weight loss and lifestyle modification - Weight reduced from 280 pounds to 250 pounds - Engages in regular exercise, including military-style workouts and dumbbell routines - Incorporates physical activity during 12-hour night shifts - Decreased intake of sweets, especially during the holiday season  Metabolic health monitoring - Due for routine blood work, including hemoglobin A1c and cholesterol panel - Fasting for blood work; has not eaten breakfast  Occupational and psychosocial factors - 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 0  Number falls in past yr: 0  Injury with Fall? 0  Risk for fall due to : No Fall Risks  Follow up Falls evaluation completed     Most recent depression screenings:    01/16/2025    8:37 AM 09/01/2023   11:13 AM  PHQ 2/9 Scores  PHQ - 2 Score 0 0        Patient Care Team: Alvan Dorothyann JONETTA, MD as PCP - General (Family Medicine) Berta Ozell MATSU, MD as Referring Physician (Gastroenterology)   ROS    Objective:    BP 130/78   Pulse 80   Ht 6' 2 (1.88 m)   Wt 251 lb (113.9 kg)   SpO2 99%   BMI 32.23 kg/m     Physical Exam Vitals and nursing note reviewed.  Constitutional:      Appearance: Normal appearance.  HENT:     Head: Normocephalic and atraumatic.     Right Ear: Tympanic membrane, ear canal and external ear normal.     Left Ear: Tympanic membrane, ear canal and external ear normal.     Nose: Nose normal.     Mouth/Throat:     Pharynx: Oropharynx is clear.  Eyes:     Extraocular Movements: Extraocular movements intact.     Conjunctiva/sclera: Conjunctivae normal.     Pupils: Pupils are equal, round, and reactive to light.  Neck:     Thyroid: No thyromegaly.  Cardiovascular:     Rate and Rhythm: Normal rate and regular  rhythm.  Pulmonary:     Effort: Pulmonary effort is normal.     Breath sounds: Normal breath sounds.  Abdominal:     General: Bowel sounds are normal.     Palpations: Abdomen is soft.     Tenderness: There is no abdominal tenderness.  Musculoskeletal:        General: No swelling.     Cervical back: Neck supple.  Skin:    General: Skin is warm and dry.  Neurological:     Mental Status: He is alert and oriented to person, place, and time.  Psychiatric:        Mood and Affect: Mood normal.        Behavior: Behavior normal.       No results found for any visits on 01/16/25.       Assessment & Plan:    Routine Health Maintenance and Physical Exam Immunization History   Administered Date(s) Administered   PFIZER(Purple Top)SARS-COV-2 Vaccination 12/29/2020    Health Maintenance  Topic Date Due   HIV Screening  Never done   DTaP/Tdap/Td (1 - Tdap) Never done   Hepatitis B Vaccines 19-59 Average Risk (1 of 3 - 19+ 3-dose series) Never done   COVID-19 Vaccine (2 - 2025-26 season) 08/27/2024   Influenza Vaccine  03/26/2025 (Originally 07/27/2024)   Colonoscopy  07/25/2025   HPV VACCINES (No Doses Required) Completed   Hepatitis C Screening  Completed   Pneumococcal Vaccine  Aged Out   Meningococcal B Vaccine  Aged Out    Discussed health benefits of physical activity, and encouraged him to engage in regular exercise appropriate for his age and condition.  Problem List Items Addressed This Visit   None Visit Diagnoses       Wellness examination    -  Primary   Relevant Orders   CMP14+EGFR   Lipid panel   CBC   HgB A1c       Assessment and Plan Assessment & Plan Overweight Weight reduced from 280 lbs to 250 lbs through exercise and diet. Maintained good dietary control. - Ordered A1c and cholesterol levels. - Encouraged continued exercise and healthy eating.   Wellness -  - will get update labs.   - Encouraged continued exercise and healthy eating. - Plan to due Tdap at next OV    Return in about 1 year (around 01/16/2026) for Wellness Exam.    Dorothyann Byars, MD [facility] at [facility]    "

## 2025-01-17 LAB — LIPID PANEL
Chol/HDL Ratio: 5.8 ratio — ABNORMAL HIGH (ref 0.0–5.0)
Cholesterol, Total: 175 mg/dL (ref 100–199)
HDL: 30 mg/dL — ABNORMAL LOW
LDL Chol Calc (NIH): 54 mg/dL (ref 0–99)
Triglycerides: 613 mg/dL (ref 0–149)
VLDL Cholesterol Cal: 91 mg/dL — ABNORMAL HIGH (ref 5–40)

## 2025-01-17 LAB — CMP14+EGFR
ALT: 33 IU/L (ref 0–44)
AST: 25 IU/L (ref 0–40)
Albumin: 4.5 g/dL (ref 4.1–5.1)
Alkaline Phosphatase: 103 IU/L (ref 47–123)
BUN/Creatinine Ratio: 11 (ref 9–20)
BUN: 12 mg/dL (ref 6–24)
Bilirubin Total: 0.4 mg/dL (ref 0.0–1.2)
CO2: 18 mmol/L — ABNORMAL LOW (ref 20–29)
Calcium: 9.4 mg/dL (ref 8.7–10.2)
Chloride: 103 mmol/L (ref 96–106)
Creatinine, Ser: 1.08 mg/dL (ref 0.76–1.27)
Globulin, Total: 2.7 g/dL (ref 1.5–4.5)
Glucose: 97 mg/dL (ref 70–99)
Potassium: 4.3 mmol/L (ref 3.5–5.2)
Sodium: 139 mmol/L (ref 134–144)
Total Protein: 7.2 g/dL (ref 6.0–8.5)
eGFR: 85 mL/min/1.73

## 2025-01-17 LAB — CBC
Hematocrit: 44.1 % (ref 37.5–51.0)
Hemoglobin: 14.6 g/dL (ref 13.0–17.7)
MCH: 30.8 pg (ref 26.6–33.0)
MCHC: 33.1 g/dL (ref 31.5–35.7)
MCV: 93 fL (ref 79–97)
Platelets: 296 x10E3/uL (ref 150–450)
RBC: 4.74 x10E6/uL (ref 4.14–5.80)
RDW: 12.9 % (ref 11.6–15.4)
WBC: 5.4 x10E3/uL (ref 3.4–10.8)

## 2025-01-17 LAB — HEMOGLOBIN A1C
Est. average glucose Bld gHb Est-mCnc: 114 mg/dL
Hgb A1c MFr Bld: 5.6 % (ref 4.8–5.6)

## 2025-01-18 ENCOUNTER — Ambulatory Visit: Payer: Self-pay | Admitting: Family Medicine

## 2025-01-18 DIAGNOSIS — E781 Pure hyperglyceridemia: Secondary | ICD-10-CM

## 2025-01-18 NOTE — Progress Notes (Signed)
 Hi Ryan Vargas, your triglycerides went way up similar to about 7 years ago.  We really need to consider medication to bring this down.  When your triglycerides are greater than 400 it can put you at risk for pancreatitis.  I would recommend that we put you on either Lovaza  or Vascepa.  These can lower your triglycerides by 40 to 50% as well as lower your risk.  If you are okay with that please let me know.  It is a prescription and I would need to send it to your pharmacy and then we would recheck your labs in 3 months.  Liver and kidney function and blood count are normal.  A1c is in the normal range at this time great work.

## 2025-01-21 MED ORDER — OMEGA-3-ACID ETHYL ESTERS 1 G PO CAPS: 2.0000 g | ORAL_CAPSULE | Freq: Two times a day (BID) | ORAL | 3 refills | Status: DC

## 2025-01-21 NOTE — Progress Notes (Signed)
Meds ordered this encounter  Medications   omega-3 acid ethyl esters (LOVAZA) 1 g capsule    Sig: Take 2 capsules (2 g total) by mouth 2 (two) times daily.    Dispense:  360 capsule    Refill:  3

## 2025-01-29 ENCOUNTER — Other Ambulatory Visit (HOSPITAL_COMMUNITY): Payer: Self-pay

## 2025-01-29 ENCOUNTER — Telehealth: Payer: Self-pay

## 2025-01-29 DIAGNOSIS — E781 Pure hyperglyceridemia: Secondary | ICD-10-CM

## 2025-01-29 MED ORDER — ICOSAPENT ETHYL 1 G PO CAPS: 2.0000 g | ORAL_CAPSULE | Freq: Two times a day (BID) | ORAL | 3 refills | Status: AC

## 2025-01-29 NOTE — Telephone Encounter (Signed)
 Meds ordered this encounter  Medications   icosapent Ethyl (VASCEPA) 1 g capsule    Sig: Take 2 capsules (2 g total) by mouth 2 (two) times daily.    Dispense:  360 capsule    Refill:  3

## 2025-01-29 NOTE — Telephone Encounter (Signed)
 Per [facility] - the prior authorization for Lovaza  will not be submitted. The preferred formulary is Vascepa . If appropriate, please send a updated rx to the pharmacy. Thanks in advance.

## 2025-01-30 NOTE — Telephone Encounter (Signed)
 Attempted to contact the patient. No answer. Left a detailed vm msg regarding the new cholesterol medication. The patient was informed to stop by the pharmacy for the new medication. Direct call back was provided if the patient has any inquiries. A MyChart message was sent with the following update.
